# Patient Record
Sex: Male | Born: 1985 | Race: Black or African American | Hispanic: No | Marital: Single | Smoking: Never smoker
Health system: Southern US, Community
[De-identification: ages and names within clinical notes are randomized; demographics above are authoritative.]

---

## 2007-08-22 ENCOUNTER — Emergency Department (HOSPITAL_COMMUNITY): Admission: EM | Admit: 2007-08-22 | Discharge: 2007-08-22 | Payer: Self-pay | Admitting: Emergency Medicine

## 2011-02-23 ENCOUNTER — Emergency Department (HOSPITAL_COMMUNITY): Payer: BC Managed Care – PPO

## 2011-02-23 ENCOUNTER — Emergency Department (HOSPITAL_COMMUNITY)
Admission: EM | Admit: 2011-02-23 | Discharge: 2011-02-23 | Disposition: A | Discharge status: Home / Self Care | Payer: BC Managed Care – PPO | Attending: Emergency Medicine | Admitting: Emergency Medicine

## 2011-02-23 DIAGNOSIS — R0609 Other forms of dyspnea: Secondary | ICD-10-CM | POA: Insufficient documentation

## 2011-02-23 DIAGNOSIS — R509 Fever, unspecified: Secondary | ICD-10-CM | POA: Insufficient documentation

## 2011-02-23 DIAGNOSIS — R0989 Other specified symptoms and signs involving the circulatory and respiratory systems: Secondary | ICD-10-CM | POA: Insufficient documentation

## 2011-02-23 DIAGNOSIS — R0789 Other chest pain: Secondary | ICD-10-CM | POA: Insufficient documentation

## 2011-02-23 DIAGNOSIS — J45909 Unspecified asthma, uncomplicated: Secondary | ICD-10-CM | POA: Insufficient documentation

## 2011-02-23 LAB — DIFFERENTIAL
Basophils Absolute: 0 10*3/uL (ref 0.0–0.1)
Basophils Relative: 0 % (ref 0–1)
Eosinophils Absolute: 0 K/uL (ref 0.0–0.7)
Eosinophils Relative: 0 % (ref 0–5)
Lymphocytes Relative: 11 % — ABNORMAL LOW (ref 12–46)
Lymphs Abs: 2 K/uL (ref 0.7–4.0)
Monocytes Absolute: 2.1 K/uL — ABNORMAL HIGH (ref 0.1–1.0)
Monocytes Relative: 12 % (ref 3–12)
Neutro Abs: 13.7 10*3/uL — ABNORMAL HIGH (ref 1.7–7.7)
Neutrophils Relative %: 77 % (ref 43–77)

## 2011-02-23 LAB — POCT I-STAT, CHEM 8
BUN: 7 mg/dL (ref 6–23)
Calcium, Ion: 1.08 mmol/L — ABNORMAL LOW (ref 1.12–1.32)
Chloride: 98 meq/L (ref 96–112)
Creatinine, Ser: 1.3 mg/dL (ref 0.4–1.5)
Glucose, Bld: 119 mg/dL — ABNORMAL HIGH (ref 70–99)
HCT: 41 % (ref 39.0–52.0)
Hemoglobin: 13.9 g/dL (ref 13.0–17.0)
Potassium: 3.6 mEq/L (ref 3.5–5.1)
Sodium: 135 mEq/L (ref 135–145)
TCO2: 25 mmol/L (ref 0–100)

## 2011-02-23 LAB — CBC
HCT: 38.6 % — ABNORMAL LOW (ref 39.0–52.0)
Hemoglobin: 12.8 g/dL — ABNORMAL LOW (ref 13.0–17.0)
MCH: 27.5 pg (ref 26.0–34.0)
MCHC: 33.2 g/dL (ref 30.0–36.0)
MCV: 83 fL (ref 78.0–100.0)
Platelets: 240 K/uL (ref 150–400)
RBC: 4.65 MIL/uL (ref 4.22–5.81)
RDW: 14.1 % (ref 11.5–15.5)
WBC: 17.9 10*3/uL — ABNORMAL HIGH (ref 4.0–10.5)

## 2011-02-23 LAB — URINALYSIS, ROUTINE W REFLEX MICROSCOPIC
Bilirubin Urine: NEGATIVE
Glucose, UA: NEGATIVE mg/dL
Ketones, ur: NEGATIVE mg/dL
Leukocytes, UA: NEGATIVE
Nitrite: NEGATIVE
Protein, ur: NEGATIVE mg/dL
Specific Gravity, Urine: 1.004 — ABNORMAL LOW (ref 1.005–1.030)
Urobilinogen, UA: 0.2 mg/dL (ref 0.0–1.0)
pH: 7 (ref 5.0–8.0)

## 2011-02-23 LAB — URINE MICROSCOPIC-ADD ON

## 2011-02-23 LAB — POCT CARDIAC MARKERS
CKMB, poc: <1 ng/mL — ABNORMAL LOW (ref 1.0–8.0)
Myoglobin, poc: 80.6 ng/mL (ref 12–200)
Troponin i, poc: <0.05 ng/mL (ref 0.00–0.09)

## 2011-03-14 ENCOUNTER — Encounter: Payer: Self-pay | Admitting: Internal Medicine

## 2011-03-14 ENCOUNTER — Ambulatory Visit (INDEPENDENT_AMBULATORY_CARE_PROVIDER_SITE_OTHER): Payer: BC Managed Care – PPO | Admitting: Internal Medicine

## 2011-03-14 VITALS — BP 132/68 | HR 95 | Temp 98.2°F | Ht 71.0 in | Wt 270.2 lb

## 2011-03-14 DIAGNOSIS — R0989 Other specified symptoms and signs involving the circulatory and respiratory systems: Secondary | ICD-10-CM

## 2011-03-14 DIAGNOSIS — R0609 Other forms of dyspnea: Secondary | ICD-10-CM

## 2011-03-14 DIAGNOSIS — J45901 Unspecified asthma with (acute) exacerbation: Secondary | ICD-10-CM

## 2011-03-14 DIAGNOSIS — R05 Cough: Secondary | ICD-10-CM

## 2011-03-14 DIAGNOSIS — R0600 Dyspnea, unspecified: Secondary | ICD-10-CM

## 2011-03-14 DIAGNOSIS — R059 Cough, unspecified: Secondary | ICD-10-CM | POA: Insufficient documentation

## 2011-03-14 MED ORDER — MOMETASONE FURO-FORMOTEROL FUM 200-5 MCG/ACT IN AERO: INHALATION_SPRAY | RESPIRATORY_TRACT | Status: DC

## 2011-03-14 MED ORDER — AZITHROMYCIN 250 MG PO TABS: ORAL_TABLET | ORAL | Status: AC

## 2011-03-14 NOTE — Assessment & Plan Note (Addendum)
DDX of  difficult airways managment all start with A and  include Adherence, Ace Inhibitors, Acid Reflux, Active Sinus Disease, Alpha 1 Antitripsin deficiency, Anxiety masquerading as Airways dz,  ABPA,  allergy(esp in young), Aspiration (esp in elderly), Adverse effects of DPI,  Active smokers, plus two Bs  = Bronchiectasis and Beta blocker use..and one C= CHF   In this case Adherence is the biggest issue and starts with  inability to use HFA effectively and also  understand that SABA treats the symptoms but doesn't get to the underlying problem (inflammation).  I used  the analogy of putting steroid cream on a rash to help explain the meaning of topical therapy and the need to get the drug to the target tissue.    The proper method of use, as well as anticipated side effects, of this metered-dose inhaler are discussed and demonstrated to the patient. Improved to 75% p coaching.  ? Acid reflux >  See instructions for specific recommendations which were reviewed directly with the patient who was given a copy with highlighter outlining the key components.   ? Acute/ active Sinus dz > next step if not controlled   ? Allergic > next step if not controlled

## 2011-03-14 NOTE — Progress Notes (Signed)
  Subjective:    Patient ID: Ryan Levy, male    DOB: December 11, 1985, 25 y.o.   MRN: [id number]  HPI  Primary = Leonides Sake   24 yobm asthma all his life previously followed in [place] and never saw specialist and went months at a time without rx. Moved into new house in August 2011 and referred by Dr Tiburcio Pea for eval of difficult to control symptoms since Jan 12   03/14/2011 Initial pulmonary office eval cc new onset Jan 2012 bilateral chest pain/ tightness,  worse sitting still and worse lying down  Assoc with sob and then classic (for him)  asthma flare and everything better when prednsione rx from ER [facility] since 3/29 and flared with taper so restarted by Dr Tiburcio Pea and "all better again".  No discolored sputum. Flares occurred while on new maint rx with singulair and advair @ max dose  Pt denies any significant sore throat, dysphagia, itching, sneezing,  nasal congestion or excess/ purulent secretions,  fever, chills, sweats, unintended wt loss, pleuritic or exertional cp, hempoptysis, orthopnea pnd or leg swelling.    Also denies any obvious fluctuation of symptoms with weather or environmental changes or other aggravating or alleviating factors.       PMHx Asthma     - HFA 75% 03/14/2011     Review of Systems  Constitutional: Negative for fever, chills, activity change, appetite change and unexpected weight change.  HENT: Positive for congestion. Negative for sore throat, rhinorrhea, sneezing, trouble swallowing, dental problem, voice change and postnasal drip.   Eyes: Negative for visual disturbance.  Respiratory: Positive for cough and shortness of breath. Negative for choking.   Cardiovascular: Positive for chest pain. Negative for leg swelling.  Gastrointestinal: Negative for nausea, vomiting and abdominal pain.  Genitourinary: Negative for difficulty urinating.  Musculoskeletal: Negative for arthralgias.  Skin: Negative for rash.  Neurological: Positive for headaches.    Psychiatric/Behavioral: Negative for behavioral problems and confusion.       Objective:   Physical Exam    robust muscular black male nad Wt  270 03/14/2011  HEENT mild turbinate edema.  Oropharynx no thrush or excess pnd or cobblestoning.  No JVD or cervical adenopathy. Mild accessory muscle hypertrophy. Trachea midline, nl thryroid. Chest was hyperinflated by percussion with diminished breath sounds and moderate increased exp time with bilateral mid exp wheeze. Hoover sign positive at mid inspiration. Regular rate and rhythm without murmur gallop or rub or increase P2 or edema.  Abd: no hsm, nl excursion. Ext warm without cyanosis or clubbing.      Assessment & Plan:

## 2011-03-14 NOTE — Patient Instructions (Signed)
Dulera 200  Take 2 puffs first thing in am and then another 2 puffs about 12 hours later.  If not breathing well after dulera ok to proaire and back it up with your nebulizer   Work on inhaler technique:  relax and gently blow all the way out then take a nice smooth deep breath back in, triggering the inhaler at same time you start breathing in.  Hold for up to 5 seconds if you can.  Rinse and gargle with water when done   If your mouth or throat starts to bother you,   I suggest you time the inhaler to your dental care and after using the inhaler(s) brush teeth and tongue with a baking soda containing toothpaste and when you rinse this out, gargle with it first to see if this helps your mouth and throat.     Taper prednisone off and finish the zpak  As long as your asthma flares  Prilosec 20 mg Take 30-60 min before first meal of the day and Pepcid 20 mg one at bedtime because reflux is to asthma what oxygen is to fire.   GERD (REFLUX)  is an extremely common cause of respiratory symptoms, many times with no significant heartburn at all.    It can be treated with medication, but also with lifestyle changes including avoidance of late meals, excessive alcohol, smoking cessation, and avoid fatty foods, chocolate, peppermint, colas, red wine, and acidic juices such as orange juice.  NO MINT OR MENTHOL PRODUCTS SO NO COUGH DROPS  USE SUGARLESS CANDY INSTEAD (jolley ranchers or Stover's)  NO OIL BASED VITAMINS   Please schedule a follow up office visit in 2 weeks, sooner if needed

## 2011-03-14 NOTE — Assessment & Plan Note (Signed)
The most common causes of chronic cough in immunocompetent adults include the following: upper airway cough syndrome (UACS), previously referred to as postnasal drip syndrome (PNDS), which is caused by variety of rhinosinus conditions; (2) asthma; (3) GERD; (4) chronic bronchitis from cigarette smoking or other inhaled environmental irritants; (5) nonasthmatic eosinophilic bronchitis; and (6) bronchiectasis.   These conditions, singly or in combination, have accounted for up to 94% of the causes of chronic cough in prospective studies.   Other conditions have constituted no >6% of the causes in prospective studies These have included bronchogenic carcinoma, chronic interstitial pneumonia, sarcoidosis, left ventricular failure, ACEI-induced cough, and aspiration from a condition associated with pharyngeal dysfunction.   .Chronic cough is often simultaneously caused by more than one condition. A single cause has been found from 38 to 82% of the time, multiple causes from 18 to 62%. Multiply caused cough has been the result of three diseases up to 42% of the time.    Much of his cough is suggestive of Upper airway cough syndrome, so named because it's frequently impossible to sort out how much is  CR/sinusitis with freq throat clearing (which can be related to primary GERD)   vs  causing  secondary (" extra esophageal")  GERD from wide swings in gastric pressure that occur with throat clearing, often  promoting self use of mint and menthol lozenges that reduce the lower esophageal sphincter tone and exacerbate the problem further in a cyclical fashion.   These are the same pts who not infrequently have failed to tolerate ace inhibitors,  dry powder inhalers or biphosphonates or report having reflux symptoms that don't respond to standard doses of PPI , and are easily confused as having aecopd or asthma flares, so for now stop the dpi and rx reflux then sinus ct if continues to be difficult to control.

## 2011-03-21 ENCOUNTER — Emergency Department (HOSPITAL_COMMUNITY)
Admission: EM | Admit: 2011-03-21 | Discharge: 2011-03-21 | Disposition: A | Discharge status: Home / Self Care | Payer: BC Managed Care – PPO | Attending: Emergency Medicine | Admitting: Emergency Medicine

## 2011-03-21 DIAGNOSIS — Z79899 Other long term (current) drug therapy: Secondary | ICD-10-CM | POA: Insufficient documentation

## 2011-03-21 DIAGNOSIS — J45909 Unspecified asthma, uncomplicated: Secondary | ICD-10-CM | POA: Insufficient documentation

## 2011-03-21 DIAGNOSIS — K2970 Gastritis, unspecified, without bleeding: Secondary | ICD-10-CM | POA: Insufficient documentation

## 2011-03-21 DIAGNOSIS — K219 Gastro-esophageal reflux disease without esophagitis: Secondary | ICD-10-CM | POA: Insufficient documentation

## 2011-03-21 DIAGNOSIS — R071 Chest pain on breathing: Secondary | ICD-10-CM | POA: Insufficient documentation

## 2011-03-21 DIAGNOSIS — R0602 Shortness of breath: Secondary | ICD-10-CM | POA: Insufficient documentation

## 2011-03-21 DIAGNOSIS — R1013 Epigastric pain: Secondary | ICD-10-CM | POA: Insufficient documentation

## 2011-03-22 ENCOUNTER — Encounter: Payer: Self-pay | Admitting: Pulmonary Disease

## 2011-03-22 ENCOUNTER — Ambulatory Visit (INDEPENDENT_AMBULATORY_CARE_PROVIDER_SITE_OTHER)
Admission: RE | Admit: 2011-03-22 | Discharge: 2011-03-22 | Disposition: A | Discharge status: Home / Self Care | Payer: BC Managed Care – PPO | Source: Ambulatory Visit | Attending: Pulmonary Disease | Admitting: Pulmonary Disease

## 2011-03-22 ENCOUNTER — Other Ambulatory Visit (INDEPENDENT_AMBULATORY_CARE_PROVIDER_SITE_OTHER): Payer: BC Managed Care – PPO

## 2011-03-22 ENCOUNTER — Ambulatory Visit (INDEPENDENT_AMBULATORY_CARE_PROVIDER_SITE_OTHER): Payer: BC Managed Care – PPO | Admitting: Pulmonary Disease

## 2011-03-22 VITALS — BP 148/96 | HR 116 | Temp 101.5°F | Ht 71.0 in | Wt 276.2 lb

## 2011-03-22 DIAGNOSIS — R0600 Dyspnea, unspecified: Secondary | ICD-10-CM

## 2011-03-22 DIAGNOSIS — R509 Fever, unspecified: Secondary | ICD-10-CM

## 2011-03-22 DIAGNOSIS — J45901 Unspecified asthma with (acute) exacerbation: Secondary | ICD-10-CM

## 2011-03-22 DIAGNOSIS — R0609 Other forms of dyspnea: Secondary | ICD-10-CM

## 2011-03-22 LAB — CBC WITH DIFFERENTIAL/PLATELET
Basophils Relative: 0.1 % (ref 0.0–3.0)
Eosinophils Relative: 0.1 % (ref 0.0–5.0)
HCT: 37.4 % — ABNORMAL LOW (ref 39.0–52.0)
Hemoglobin: 12.6 g/dL — ABNORMAL LOW (ref 13.0–17.0)
Lymphocytes Relative: 5 % — ABNORMAL LOW (ref 12.0–46.0)
Lymphs Abs: 1.1 10*3/uL (ref 0.7–4.0)
Monocytes Relative: 5.9 % (ref 3.0–12.0)
Neutro Abs: 19.5 10*3/uL — ABNORMAL HIGH (ref 1.4–7.7)
RBC: 4.39 Mil/uL (ref 4.22–5.81)
RDW: 15.9 % — ABNORMAL HIGH (ref 11.5–14.6)

## 2011-03-22 LAB — COMPREHENSIVE METABOLIC PANEL
ALT: 17 U/L (ref 0–53)
BUN: 6 mg/dL (ref 6–23)
CO2: 30 mEq/L (ref 19–32)
Calcium: 9.2 mg/dL (ref 8.4–10.5)
Chloride: 95 mEq/L — ABNORMAL LOW (ref 96–112)
Creatinine, Ser: 1.2 mg/dL (ref 0.4–1.5)
GFR: 98.49 mL/min (ref >60.00)
Total Bilirubin: 1.2 mg/dL (ref 0.3–1.2)

## 2011-03-22 MED ORDER — LEVOFLOXACIN 500 MG PO TABS: 500.0000 mg | ORAL_TABLET | Freq: Every day | ORAL | Status: DC

## 2011-03-22 MED ORDER — PREDNISONE 10 MG PO TABS: ORAL_TABLET | ORAL | Status: DC

## 2011-03-22 NOTE — Progress Notes (Signed)
Subjective:    Patient ID: Ryan Levy, male    DOB: 1986-04-09, 25 y.o.   MRN: [id number]  HPI 25 yo male with asthma, chest pain, and fever.  He was seen by Dr. Sherene Sires on 03/14/11 for asthma and chronic cough.  He was started on dulera and zithromax.  He finished prednisone a few days ago, and said that his symptoms got worse since then.  He has been getting more short of breath.  He has pain around his ribs when he breaths.  He tries to cough, but then can't get his wind.  He has been wheezing more.  He has been feeling feverish since this morning, and feeling sweaty.  His sinuses are okay.  He has a sore throat and it hurts when he swallows.  He hasn't been able to eat much, and does not have much of an appetite.  He has been getting nausea and dry heaves.    He denies skin rash, gland swelling, leg swelling, or diarrhea.  He does not smoke cigarettes and denies illicit drug use.  He has occasional alcohol use.  He works as a Psychologist, occupational with [employer].  He denies sick exposures or travel history.  Past Medical History  Diagnosis Date  . Asthma      Family History  Problem Relation Age of Onset  . Asthma Maternal Grandfather      History   Social History  . Marital Status: Single    Spouse Name: N/A    Number of Children: N/A  . Years of Education: N/A   Occupational History  . Banker    Social History Main Topics  . Smoking status: Never Smoker   . Smokeless tobacco: Never Used  . Alcohol Use: No     social  . Drug Use: No  . Sexually Active: Not on file   Other Topics Concern  . Not on file   Social History Narrative  . No narrative on file     No Known Allergies   Outpatient Prescriptions Prior to Visit  Medication Sig Dispense Refill  . albuterol (PROAIR HFA) 108 (90 BASE) MCG/ACT inhaler Inhale 2 puffs into the lungs every 6 (six) hours as needed.        Marland Kitchen albuterol (PROVENTIL) (2.5 MG/3ML) 0.083% nebulizer solution Take 1 vial by nebulization Every 4  hours as needed.      . fluticasone (FLONASE) 50 MCG/ACT nasal spray 1 puff by Nasal route Twice daily.      . Mometasone Furo-Formoterol Fum (DULERA) 200-5 MCG/ACT AERO Take 2 puffs first thing in am and then another 2 puffs about 12 hours later.         . montelukast (SINGULAIR) 10 MG tablet Take 10 mg by mouth daily.        . meloxicam (MOBIC) 7.5 MG tablet Take 1 tablet by mouth as needed.      . predniSONE (DELTASONE) 10 MG tablet Tapered dose as directed            Review of Systems     Objective:   Physical Exam Filed Vitals:   03/22/11 0937  BP: 148/96  Pulse: 116  Temp: 101.5 F (38.6 C)  TempSrc: Oral  Height: 5\' 11"  (1.803 m)  Weight: 276 lb 3.2 oz (125.283 kg)  SpO2: 99%   General - Obese, sweaty, speaks in short sentences HEENT - PERRLA, EOMI, no sinus tenderness, no nasal discharge, no oral exudate, no LAN, no thyromegaly Cardiac - s1s2  regular, tachycardic, no murmur, pulses symmetric Chest - tender on palpation over rib margin, diminished breath sounds, no wheeze/rales, no dullness Abd - soft, mild epigastric tenderness, normal bowel sounds, no organomegaly Ext - no E/C/C Neuro - normal strength, CN intact, A&O x 3 Psych - anxious     CHEST - 2 VIEW 03/22/11:  Comparison: 02/23/2011.  Findings: Trachea is midline. Heart size stable. Lungs are low in volume with right perihilar and bibasilar subsegmental atelectasis. No pleural fluid.  IMPRESSION: Low lung volumes with right perihilar and bibasilar subsegmental atelectasis.  WBC 22.  Other labs from today reviewed.   Assessment & Plan:   Fever Will change him from zithromax to levaquin.  Advised him to call if his symptoms do not improve.  Will schedule follow up next week.  Asthma flare He is to continue dulera, singulair and prn albuterol.  Will give him another course of prednisone, and then have this re-assessed next week.    Updated Medication List Outpatient Encounter  Prescriptions as of 03/22/2011  Medication Sig Dispense Refill  . albuterol (PROAIR HFA) 108 (90 BASE) MCG/ACT inhaler Inhale 2 puffs into the lungs every 6 (six) hours as needed.        Marland Kitchen albuterol (PROVENTIL) (2.5 MG/3ML) 0.083% nebulizer solution Take 1 vial by nebulization Every 4 hours as needed.      . fluticasone (FLONASE) 50 MCG/ACT nasal spray 1 puff by Nasal route Twice daily.      . Mometasone Furo-Formoterol Fum (DULERA) 200-5 MCG/ACT AERO Take 2 puffs first thing in am and then another 2 puffs about 12 hours later.         . montelukast (SINGULAIR) 10 MG tablet Take 10 mg by mouth daily.        Marland Kitchen oxyCODONE-acetaminophen (PERCOCET) 10-325 MG per tablet 2 tablets as needed every 6 hrs       . DISCONTD: azithromycin (ZITHROMAX) 250 MG tablet Take 2 tablets by mouth on day 1, followed by 1 tablet by mouth daily for 4 days. Once a day x 2 more days       . levofloxacin (LEVAQUIN) 500 MG tablet Take 1 tablet (500 mg total) by mouth daily.  7 tablet  0  . predniSONE (DELTASONE) 10 MG tablet 3 pills daily for 2 days, 2 pills daily for 2 days, 1 pill daily for 2 days, 1/2 pill daily for 2 days  13 tablet  0  . DISCONTD: meloxicam (MOBIC) 7.5 MG tablet Take 1 tablet by mouth as needed.      Marland Kitchen DISCONTD: predniSONE (DELTASONE) 10 MG tablet Tapered dose as directed

## 2011-03-22 NOTE — Assessment & Plan Note (Addendum)
He is to continue dulera, singulair and prn albuterol.  Will give him another course of prednisone, and then have this re-assessed next week.

## 2011-03-22 NOTE — Assessment & Plan Note (Addendum)
Will change him from zithromax to levaquin.  Advised him to call if his symptoms do not improve.  Will schedule follow up next week.

## 2011-03-22 NOTE — Patient Instructions (Addendum)
Stop zithromax Levaquin 500 mg daily for 7 days Prednisone 10 mg pills: 3 pills for 2 days, 2 pills for 2 days, 1 pill for 2 days, 1/2 pill for 2 days Follow up with Dr. Craige Cotta or Tammy Parrett in one week

## 2011-03-27 ENCOUNTER — Encounter: Payer: Self-pay | Admitting: Adult Health

## 2011-03-28 ENCOUNTER — Encounter: Payer: Self-pay | Admitting: Adult Health

## 2011-03-28 ENCOUNTER — Institutional Professional Consult (permissible substitution): Payer: BC Managed Care – PPO | Admitting: Pulmonary Disease

## 2011-03-28 ENCOUNTER — Ambulatory Visit (INDEPENDENT_AMBULATORY_CARE_PROVIDER_SITE_OTHER): Payer: BC Managed Care – PPO | Admitting: Adult Health

## 2011-03-28 ENCOUNTER — Ambulatory Visit (INDEPENDENT_AMBULATORY_CARE_PROVIDER_SITE_OTHER)
Admission: RE | Admit: 2011-03-28 | Discharge: 2011-03-28 | Disposition: A | Discharge status: Home / Self Care | Payer: BC Managed Care – PPO | Source: Ambulatory Visit | Attending: Adult Health | Admitting: Adult Health

## 2011-03-28 ENCOUNTER — Other Ambulatory Visit (INDEPENDENT_AMBULATORY_CARE_PROVIDER_SITE_OTHER): Payer: BC Managed Care – PPO

## 2011-03-28 VITALS — BP 118/74 | HR 97 | Temp 97.9°F | Ht 71.0 in | Wt 266.4 lb

## 2011-03-28 DIAGNOSIS — R091 Pleurisy: Secondary | ICD-10-CM

## 2011-03-28 DIAGNOSIS — J45901 Unspecified asthma with (acute) exacerbation: Secondary | ICD-10-CM

## 2011-03-28 LAB — CBC WITH DIFFERENTIAL/PLATELET
Basophils Absolute: 0.1 10*3/uL (ref 0.0–0.1)
Eosinophils Absolute: 0.1 10*3/uL (ref 0.0–0.7)
HCT: 38.8 % — ABNORMAL LOW (ref 39.0–52.0)
Hemoglobin: 13 g/dL (ref 13.0–17.0)
Lymphs Abs: 2.6 10*3/uL (ref 0.7–4.0)
MCHC: 33.5 g/dL (ref 30.0–36.0)
MCV: 84.9 fl (ref 78.0–100.0)
Monocytes Absolute: 0.8 10*3/uL (ref 0.1–1.0)
Monocytes Relative: 6.1 % (ref 3.0–12.0)
Neutro Abs: 10.3 10*3/uL — ABNORMAL HIGH (ref 1.4–7.7)
Platelets: 360 10*3/uL (ref 150.0–400.0)
RDW: 15.7 % — ABNORMAL HIGH (ref 11.5–14.6)

## 2011-03-28 LAB — SEDIMENTATION RATE: Sed Rate: 85 mm/hr — ABNORMAL HIGH (ref 0–22)

## 2011-03-28 NOTE — Assessment & Plan Note (Addendum)
Slowly resolving asthmatic broncitis PT w/ residual pleuritic left chest pain, and recurrent bronchitic symptoms after steroid taper  Will recheck cxr to r/o possible PNA,etc . Recheck labs w/ cbc and esr to check for decrease wbc  Plan:  tx w/ NSAIDS for pleurisy, hold on additional steroids at this time.  Heat to ribs.  mucinex dm As needed  For cough.  Labs and xray pending.  follow up Dr. Craige Cotta  In 3 weeks and As needed   Please contact office for sooner follow up if symptoms do not improve or worsen or seek emergency care

## 2011-03-28 NOTE — Patient Instructions (Addendum)
Advil 200mg  3 tabs Twice daily  With food for 7 days .  Warm heat to ribs and shoulder.  Continue on Dulera 2 puffs Twice daily   follow up Dr. Craige Cotta  In 3 weeks and As needed   Please contact office for sooner follow up if symptoms do not improve or worsen or seek emergency care   I will call with xray and lab results

## 2011-03-28 NOTE — Progress Notes (Signed)
Subjective:    Patient ID: Ryan Levy, male    DOB: 1986-06-30, 25 y.o.   MRN: [id number]  HPI 25 yo male with asthma, chest pain, and fever.  03/22/11  He was seen by Dr. Sherene Sires on 03/14/11 for asthma and chronic cough.  He was started on dulera and zithromax.  He finished prednisone a few days ago, and said that his symptoms got worse since then. He has been getting more short of breath.  He has pain around his ribs when he breaths.  He tries to cough, but then can't get his wind.  He has been wheezing more.  He has been feeling feverish since this morning, and feeling sweaty.  His sinuses are okay.  He has a sore throat and it hurts when he swallows.  He hasn't been able to eat much, and does not have much of an appetite.  He has been getting nausea and dry heaves.  >>tx w/ levaquin and steroid taper.    03/28/11 Follow up  PT presents for follow up. Seen last ov with suspected asthmatic bronchitis tx with steroid taper and Levaquin. WBC was elevated , CXR showed some bibasilar atx w/ low lung volumes. He returns today with improved cough, congestion and wheezing .  However  began having pain again today w/ the decrease in prednisone, progressively getting worse w/ deep inhalations.    Still has cough but not as bad as last week.  Says he had this same episode earlier year and was seen in ER and PCP . This is his 3rd steroid taper. As he finishes his  Prednisone his symptoms return.  Feels very sore along upper neck muscles esp on left. Pain on inspiration along left lateral ribs.  NO vomitting or abd pain. NO urinary symptoms or rash. NO joint swelling.   He is leaving for [facility] in few days.   Past Medical History  Diagnosis Date  . Asthma      Family History  Problem Relation Age of Onset  . Asthma Maternal Grandfather      History   Social History  . Marital Status: Single    Spouse Name: N/A    Number of Children: N/A  . Years of Education: N/A   Occupational History  .  Banker    Social History Main Topics  . Smoking status: Never Smoker   . Smokeless tobacco: Never Used  . Alcohol Use: No     social  . Drug Use: No  . Sexually Active: Not on file   Other Topics Concern  . Not on file   Social History Narrative  . No narrative on file     No Known Allergies   Outpatient Prescriptions Prior to Visit  Medication Sig Dispense Refill  . albuterol (PROAIR HFA) 108 (90 BASE) MCG/ACT inhaler Inhale 2 puffs into the lungs every 6 (six) hours as needed.        Marland Kitchen albuterol (PROVENTIL) (2.5 MG/3ML) 0.083% nebulizer solution Take 1 vial by nebulization Every 4 hours as needed.      . Mometasone Furo-Formoterol Fum (DULERA) 200-5 MCG/ACT AERO Take 2 puffs first thing in am and then another 2 puffs about 12 hours later.         . montelukast (SINGULAIR) 10 MG tablet Take 10 mg by mouth daily.        Marland Kitchen oxyCODONE-acetaminophen (PERCOCET) 10-325 MG per tablet 2 tablets as needed every 6 hrs       . predniSONE (  DELTASONE) 10 MG tablet 3 pills daily for 2 days, 2 pills daily for 2 days, 1 pill daily for 2 days, 1/2 pill daily for 2 days  13 tablet  0  . fluticasone (FLONASE) 50 MCG/ACT nasal spray 1 puff by Nasal route Twice daily.      Marland Kitchen levofloxacin (LEVAQUIN) 500 MG tablet Take 1 tablet (500 mg total) by mouth daily.  7 tablet  0        Review of Systems Constitutional:   No  weight loss, night sweats,  Fevers, chills   HEENT:   No headaches,  Difficulty swallowing,  Tooth/dental problems, or  Sore throat,                No sneezing, itching, ear ache, nasal congestion, post nasal drip,   CV:  No chest pain,  Orthopnea, PND, swelling in lower extremities, anasarca, dizziness, palpitations, syncope.   GI  No heartburn, indigestion, abdominal pain, nausea, vomiting, diarrhea, change in bowel habits, loss of appetite, bloody stools.   Resp:    No coughing up of blood.  No change in color of mucus.  No wheezing.  No chest wall deformity  Skin: no rash  or lesions.  GU: no dysuria, change in color of urine, no urgency or frequency.  No flank pain, no hematuria   MS:  No joint pain or swelling.  No decreased range of motion.  No back pain.  Psych:  No change in mood or affect. No depression or anxiety.  No memory loss.          Objective:   Physical Exam  Filed Vitals:   03/28/11 1522  BP: 118/74  Pulse: 97  Temp: 97.9 F (36.6 C)  TempSrc: Oral  Height: 5\' 11"  (1.803 m)  Weight: 266 lb 6.4 oz (120.838 kg)  SpO2: 97%   General - Obese, NAD  HEENT - PERRLA, EOMI, no sinus tenderness, no nasal discharge, no oral exudate, no LAN, no thyromegaly Cardiac - s1s2 regular, tachycardic, no murmur, pulses symmetric Chest - tender on palpation over rib margin along left  NML BS bilaterally , no wheeze/rales, no dullness Abd - soft, mild epigastric tenderness, normal bowel sounds, no organomegaly Ext - no E/C/C Neuro - normal strength, CN intact, A&O x 3, neg nuchal rigidity.  Psych - nml  Muscul: cervical ROM nml , no radicular sympotms, equal strength bilaterally.        Assessment & Plan:   No problem-specific assessment & plan notes found for this encounter.   Updated Medication List Outpatient Encounter Prescriptions as of 03/28/2011  Medication Sig Dispense Refill  . albuterol (PROAIR HFA) 108 (90 BASE) MCG/ACT inhaler Inhale 2 puffs into the lungs every 6 (six) hours as needed.        Marland Kitchen albuterol (PROVENTIL) (2.5 MG/3ML) 0.083% nebulizer solution Take 1 vial by nebulization Every 4 hours as needed.      . Mometasone Furo-Formoterol Fum (DULERA) 200-5 MCG/ACT AERO Take 2 puffs first thing in am and then another 2 puffs about 12 hours later.         . montelukast (SINGULAIR) 10 MG tablet Take 10 mg by mouth daily.        Marland Kitchen oxyCODONE-acetaminophen (PERCOCET) 10-325 MG per tablet 2 tablets as needed every 6 hrs       . predniSONE (DELTASONE) 10 MG tablet 3 pills daily for 2 days, 2 pills daily for 2 days, 1 pill daily for  2 days, 1/2 pill  daily for 2 days  13 tablet  0  . fluticasone (FLONASE) 50 MCG/ACT nasal spray 1 puff by Nasal route Twice daily.      . ranitidine (ZANTAC) 150 MG tablet Take by mouth as needed.      Marland Kitchen DISCONTD: levofloxacin (LEVAQUIN) 500 MG tablet Take 1 tablet (500 mg total) by mouth daily.  7 tablet  0

## 2011-03-29 ENCOUNTER — Telehealth: Payer: Self-pay | Admitting: Adult Health

## 2011-03-29 MED ORDER — PREDNISONE 10 MG PO TABS: ORAL_TABLET | ORAL | Status: DC

## 2011-03-29 NOTE — Telephone Encounter (Signed)
Spoke w/ pt and advised him we were going to get in touch w/ Tammy to decide what she wants to do next. Pt states he is in a lot of pain and the advil is not helping. Pt states he will got to the ER if his pain gets worse and has not heard from [place] soon. WIll send to Shanda Bumps so she can document when she will speak to TP.

## 2011-03-29 NOTE — Telephone Encounter (Signed)
lmomtcb x1 

## 2011-03-29 NOTE — Progress Notes (Signed)
Reviewed

## 2011-03-29 NOTE — Telephone Encounter (Signed)
Patient called back wanted to know if we ever got in touch with Tammy P. he can be reached at [phone]. Per Lawson Fiscal if patient is in that much pain he needs to go to the ER he stated that he was going to his PCP and find out if there is someone else he can start using. Stated that we wanted our money on time but when he needed [place] the doctor wasn't here to help him.Vedia Coffer

## 2011-03-29 NOTE — Telephone Encounter (Signed)
Patient called back stated that he was in a lot of pain doesn't want it to get worse Advil isn't helping. He can't take deep breathe he can be reached at [phone] or [phone].Vedia Coffer

## 2011-03-29 NOTE — Telephone Encounter (Signed)
Called spoke with TP about pt's cxr and lab results.  Per TP: cxr shows nothing acute.  Sed rate elevated.  Please send pred taper and schedule appt w/ VS in 2 weeks to follow up.  Prednisone 10mg  #30, 4 tabs x 3days, 3 x 3days, 2 x 3 days, 1 x 3days and stop.  Called spoke with patient who expressed his frustration because he thought that someone was going to be in the office in case he called and needed something for the pain.  i apologized to the pt for the misunderstanding and the delay in getting back in touch with him.  Explained to pt his cxr and lab results/recs as stated by TP.  Pt verbalized his understanding.  rx sent to [facility] on [address] per pt's request.  appt w/ VS moved up from 5.23.12 to 5.14.12 @ 1330 - okay per Mindy.  Advised pt that VS will be in the office this afternoon and TP will be back all day on Thurs and Fri if he should have any more questions/concerns.  Pt okay with this and verbalized his understanding.

## 2011-03-30 ENCOUNTER — Telehealth: Payer: Self-pay | Admitting: Adult Health

## 2011-03-30 DIAGNOSIS — R059 Cough, unspecified: Secondary | ICD-10-CM

## 2011-03-30 DIAGNOSIS — R05 Cough: Secondary | ICD-10-CM

## 2011-03-30 DIAGNOSIS — R079 Chest pain, unspecified: Secondary | ICD-10-CM | POA: Insufficient documentation

## 2011-03-30 DIAGNOSIS — R509 Fever, unspecified: Secondary | ICD-10-CM

## 2011-03-30 NOTE — Telephone Encounter (Signed)
Pt with persistent symptoms each time steroids are stopped would like to set him for additional testing.  Set up CT chest w/ PE protocol. Re chest pain CT sinus -limited re cough/fever  CT neck re cough, fever, shoulder neck pain.  Labs with ESR , ANA, ANCA, CRP, RA factor  Left message on voicemail to call back

## 2011-03-30 NOTE — Telephone Encounter (Signed)
Spoke with pt advised on upcoming test and labs He feels much better since starting on steroids, with/in 1 hr pain resolved.  Pt returning on 04/04/11

## 2011-03-31 ENCOUNTER — Telehealth: Payer: Self-pay | Admitting: Adult Health

## 2011-03-31 NOTE — Telephone Encounter (Signed)
Tammy, can you please advise what his dx is that you had discussed with him earlier. Thanks!!

## 2011-03-31 NOTE — Telephone Encounter (Signed)
All labs and procedures scheduled in epic.  Per Almyra Free, pt scheduled for CT scans on 5.9.12 @ 1330.  Will close encounter.

## 2011-03-31 NOTE — Telephone Encounter (Signed)
No answer will try to call back at later time/date.

## 2011-04-05 ENCOUNTER — Ambulatory Visit (INDEPENDENT_AMBULATORY_CARE_PROVIDER_SITE_OTHER)
Admission: RE | Admit: 2011-04-05 | Discharge: 2011-04-05 | Disposition: A | Discharge status: Home / Self Care | Payer: BC Managed Care – PPO | Source: Ambulatory Visit | Attending: Adult Health | Admitting: Adult Health

## 2011-04-05 ENCOUNTER — Inpatient Hospital Stay: Admission: RE | Admit: 2011-04-05 | Payer: BC Managed Care – PPO | Source: Ambulatory Visit

## 2011-04-05 ENCOUNTER — Telehealth: Payer: Self-pay | Admitting: Adult Health

## 2011-04-05 DIAGNOSIS — R05 Cough: Secondary | ICD-10-CM

## 2011-04-05 DIAGNOSIS — R509 Fever, unspecified: Secondary | ICD-10-CM

## 2011-04-05 DIAGNOSIS — R059 Cough, unspecified: Secondary | ICD-10-CM

## 2011-04-05 DIAGNOSIS — R079 Chest pain, unspecified: Secondary | ICD-10-CM

## 2011-04-05 MED ORDER — IOHEXOL 300 MG/ML  SOLN
100.0000 mL | Freq: Once | INTRAMUSCULAR | Status: AC | PRN
  Administered 2011-04-05: 100 mL via INTRAVENOUS

## 2011-04-05 NOTE — Telephone Encounter (Signed)
Dr. Craige Cotta, TP ordered CT Chest on this pt and he wants results asap.  I advised TP out of the office this pm, so will forward to you for results/recs. Please advise thanks!

## 2011-04-06 NOTE — Telephone Encounter (Signed)
Please advise Dr. Craige Cotta pt requesting CT results ASAP. Thanks  Carver Fila, CMA

## 2011-04-06 NOTE — Telephone Encounter (Signed)
CT chest reviewed.  Left message on voicemail at listed number.  Explained that CT sinus was normal and CT chest showed basilar ground glass likely from atelectasis.  Will await remainder of lab results and then update patient about findings and additional interventions needed.

## 2011-04-06 NOTE — Telephone Encounter (Signed)
lmomtcb x1.Marland Kitchen Per Dr. Craige Cotta. Yes he can work out

## 2011-04-06 NOTE — Telephone Encounter (Signed)
Returning call.  Patient wanting to know if he can workout.  [phone]

## 2011-04-06 NOTE — Telephone Encounter (Signed)
Pt phoned stated that he had CT yesterday would like the results he can be reached at [phone].Vedia Coffer

## 2011-04-07 ENCOUNTER — Ambulatory Visit: Payer: BC Managed Care – PPO | Admitting: Internal Medicine

## 2011-04-07 NOTE — Telephone Encounter (Signed)
Pt cant answer cell at work please call [phone].Vedia Coffer

## 2011-04-07 NOTE — Telephone Encounter (Signed)
ATC pt at the number provided, he is still at lunch, [facility] later.

## 2011-04-07 NOTE — Telephone Encounter (Signed)
Noted  

## 2011-04-07 NOTE — Telephone Encounter (Signed)
Spoke w/ pt and he is aware it was fine for him to work out. Pt states he is now taking pred one tablet qd and states he is beginning to feel like he did before. Pt is coming in on Monday w/ VS for follow up. I advised pt to finished the pred since he states he has 3 days left and we will re evaluate him on Monday. Pt still wanted to make VS aware. Will forward to him as an [place]

## 2011-04-09 NOTE — Telephone Encounter (Signed)
He has ov with dr Craige Cotta on 5/14  Will discuss in detail at [facility]

## 2011-04-10 ENCOUNTER — Ambulatory Visit (INDEPENDENT_AMBULATORY_CARE_PROVIDER_SITE_OTHER): Payer: BC Managed Care – PPO | Admitting: Pulmonary Disease

## 2011-04-10 ENCOUNTER — Encounter: Payer: Self-pay | Admitting: *Deleted

## 2011-04-10 ENCOUNTER — Encounter: Payer: Self-pay | Admitting: Pulmonary Disease

## 2011-04-10 DIAGNOSIS — R091 Pleurisy: Secondary | ICD-10-CM

## 2011-04-10 DIAGNOSIS — J45909 Unspecified asthma, uncomplicated: Secondary | ICD-10-CM

## 2011-04-10 MED ORDER — PREDNISONE 5 MG PO TABS: ORAL_TABLET | ORAL | Status: DC

## 2011-04-10 NOTE — Assessment & Plan Note (Addendum)
He has continued symptoms of pleuritic chest pain that recur with tapering of dose of prednisone.  He did have elevated ESR, and recent fever which has resolved.  He did have mild, non-specific ground glass opacification on CT chest from 04/05/11, but this was done while he was on prednisone.  ANA, ANCA, RF, anti-CCP are pending.  Recent CBC showed improvement in WBC count.  He is now complaining of b/l knee pain and swell.  I will resume prednisone at 15 mg per day.  Will await lab test results.  Will also refer to rheumatology to evaluate for possible joint disease.  Asked him to call by the end of the week to update status and response to prednisone therapy.

## 2011-04-10 NOTE — Progress Notes (Signed)
Subjective:    Patient ID: Ryan Levy, male    DOB: 1986-08-24, 25 y.o.   MRN: [id number]  HPI 25 yo male with asthma, pleurisy, and fever.  He finished his course of levaquin.  He was put back on prednisone, and felt better.  Once he got down below 10 mg per day of prednisone his pleuritic chest pain recurred.  This is mostly on the right side.  He did not have any pain when on higher doses of prednisone.  He is not having fever any more.  His sinuses are better.  He still has a cough which is occasionally productive of yellow sputum.  He has noticed pain and swelling in both his knees.  He feels like his joints are tied in knots.    Past Medical History  Diagnosis Date  . Asthma      Family History  Problem Relation Age of Onset  . Asthma Maternal Grandfather      History   Social History  . Marital Status: Single    Spouse Name: N/A    Number of Children: N/A  . Years of Education: N/A   Occupational History  . Banker    Social History Main Topics  . Smoking status: Never Smoker   . Smokeless tobacco: Never Used  . Alcohol Use: No     social  . Drug Use: No  . Sexually Active: Not on file   Other Topics Concern  . Not on file   Social History Narrative  . No narrative on file     No Known Allergies   Outpatient Prescriptions Prior to Visit  Medication Sig Dispense Refill  . albuterol (PROAIR HFA) 108 (90 BASE) MCG/ACT inhaler Inhale 2 puffs into the lungs every 6 (six) hours as needed.        Marland Kitchen albuterol (PROVENTIL) (2.5 MG/3ML) 0.083% nebulizer solution Take 1 vial by nebulization Every 4 hours as needed.      . Mometasone Furo-Formoterol Fum (DULERA) 200-5 MCG/ACT AERO Take 2 puffs first thing in am and then another 2 puffs about 12 hours later.         . montelukast (SINGULAIR) 10 MG tablet Take 10 mg by mouth daily.        . ranitidine (ZANTAC) 150 MG tablet Take by mouth as needed.      . fluticasone (FLONASE) 50 MCG/ACT nasal spray 1 puff by Nasal  route Twice daily.      Marland Kitchen oxyCODONE-acetaminophen (PERCOCET) 10-325 MG per tablet 2 tablets as needed every 6 hrs       . predniSONE (DELTASONE) 10 MG tablet 3 pills daily for 2 days, 2 pills daily for 2 days, 1 pill daily for 2 days, 1/2 pill daily for 2 days  13 tablet  0  . predniSONE (DELTASONE) 10 MG tablet Take 4 tabs by mouth once daily x3 days, then 3 tabs x 3days, 2 tabs x3 days, 1 tab x3days and stop.  30 tablet  0   Review of Systems     Objective:   Physical Exam Filed Vitals:   04/10/11 1339 04/10/11 1340  BP:  140/84  Pulse:  100  Temp: 98 F (36.7 C)   TempSrc: Oral   Height: 5\' 11"  (1.803 m)   Weight: 268 lb 3.2 oz (121.655 kg)   SpO2:  100%   General - Obese, splinting right side with respiration HEENT - PERRLA, EOMI, no sinus tenderness, no nasal discharge, no oral exudate, no LAN,  no thyromegaly  Cardiac - s1s2 regular, tachycardic, no murmur, pulses symmetric  Chest - tender on palpation over rib margin, diminished breath sounds, no wheeze/rales, no dullness  Abd - soft, mild epigastric tenderness, normal bowel sounds, no organomegaly  Ext - mild swelling/tenderness of knees b/l, no edema Neuro - normal strength, CN intact, A&O x 3  Psych - anxious     Assessment & Plan:   Pleurisy He has continued symptoms of pleuritic chest pain that recur with tapering of dose of prednisone.  He did have elevated ESR, and recent fever which has resolved.  He did have mild, non-specific ground glass opacification on CT chest from 04/05/11, but this was done while he was on prednisone.  ANA, ANCA, RF, anti-CCP are pending.  Recent CBC showed improvement in WBC count.  He is now complaining of b/l knee pain and swell.  I will resume prednisone at 15 mg per day.  Will await lab test results.  Will also refer to rheumatology to evaluate for possible joint disease.  Asked him to call by the end of the week to update status and response to prednisone therapy.  Asthma Will have  him continue dulera, singulair, and as needed albuterol.    Updated Medication List Outpatient Encounter Prescriptions as of 04/10/2011  Medication Sig Dispense Refill  . albuterol (PROAIR HFA) 108 (90 BASE) MCG/ACT inhaler Inhale 2 puffs into the lungs every 6 (six) hours as needed.        Marland Kitchen albuterol (PROVENTIL) (2.5 MG/3ML) 0.083% nebulizer solution Take 1 vial by nebulization Every 4 hours as needed.      Marland Kitchen ibuprofen (ADVIL,MOTRIN) 200 MG tablet As needed       . Mometasone Furo-Formoterol Fum (DULERA) 200-5 MCG/ACT AERO Take 2 puffs first thing in am and then another 2 puffs about 12 hours later.         . montelukast (SINGULAIR) 10 MG tablet Take 10 mg by mouth daily.        . ranitidine (ZANTAC) 150 MG tablet Take by mouth as needed.      . predniSONE (DELTASONE) 5 MG tablet 3 pills daily  90 tablet  1  . DISCONTD: fluticasone (FLONASE) 50 MCG/ACT nasal spray 1 puff by Nasal route Twice daily.      Marland Kitchen DISCONTD: oxyCODONE-acetaminophen (PERCOCET) 10-325 MG per tablet 2 tablets as needed every 6 hrs       . DISCONTD: predniSONE (DELTASONE) 10 MG tablet 3 pills daily for 2 days, 2 pills daily for 2 days, 1 pill daily for 2 days, 1/2 pill daily for 2 days  13 tablet  0  . DISCONTD: predniSONE (DELTASONE) 10 MG tablet Take 4 tabs by mouth once daily x3 days, then 3 tabs x 3days, 2 tabs x3 days, 1 tab x3days and stop.  30 tablet  0

## 2011-04-10 NOTE — Patient Instructions (Signed)
Prednisone 5 mg pills: 3 pills daily Call later this week to give me an update about status Will arrange for rheumatology evaluation Follow up in 4 weeks

## 2011-04-13 ENCOUNTER — Telehealth: Payer: Self-pay | Admitting: Pulmonary Disease

## 2011-04-13 DIAGNOSIS — R0600 Dyspnea, unspecified: Secondary | ICD-10-CM

## 2011-04-13 NOTE — Telephone Encounter (Signed)
Would cont on prednisone, take 20mg  today and tomorrow then back to 15mg  per dr Craige Cotta recs Please contact office for sooner follow up if symptoms do not improve or worsen or seek emergency care  He was suppose to have had labs. ? Did he come and get these done if not needs to have them done asap

## 2011-04-13 NOTE — Telephone Encounter (Signed)
lmomtcb x1 

## 2011-04-13 NOTE — Telephone Encounter (Signed)
Spoke w/ pt and he states he is now having pain up around his neck. Pt states it hurts the most when he takes a deep breath. Pt states he is still having a hard time breathing when he is laying down. Pt is taking 5 mg prednisone 3 tablets a day. Pt states at first it didn't help at all but now it is helping some. Pt states he is not taking advil b/c he said the bottle advised not to take if he is taking prednisone. Pt is wanting to know what else to do. Since VS is not in the office will send to [facility] for any recs. Please advise Tammy. Thanks  Carver Fila, CMA

## 2011-04-13 NOTE — Telephone Encounter (Signed)
BNP added to labs that were ordered 5.3.12 per TP's request.

## 2011-04-13 NOTE — Telephone Encounter (Signed)
Spoke with pt and notified of recs per TP. Pt verbalized understanding and states that he will try to come to lab tomorrow or Monday to have labs drawn.

## 2011-04-17 ENCOUNTER — Ambulatory Visit: Payer: BC Managed Care – PPO | Admitting: Pulmonary Disease

## 2011-04-18 DIAGNOSIS — J45909 Unspecified asthma, uncomplicated: Secondary | ICD-10-CM | POA: Insufficient documentation

## 2011-04-18 NOTE — Assessment & Plan Note (Signed)
Will have him continue dulera, singulair, and as needed albuterol.

## 2011-04-19 ENCOUNTER — Telehealth: Payer: Self-pay | Admitting: *Deleted

## 2011-04-19 ENCOUNTER — Ambulatory Visit: Payer: BC Managed Care – PPO | Admitting: Pulmonary Disease

## 2011-04-19 NOTE — Telephone Encounter (Signed)
Message copied by Carver Fila on Wed Apr 19, 2011 12:55 PM ------      Message from: Coralyn Helling      Created: Tue Apr 18, 2011  5:02 PM       Can you confirm that Ryan Levy came in to get his labs done.  He needed to do the following: ANA, Rheumatoid factor, Anti-CCP, ANCA, and ACE.            He was supposed to also see rheumatology, so check to see if rheumatology hasn't had him do labs.  If he did get labs with rheumatology, then please have these labs sent to our office.  Will then decide what other labs to repeat.  Thanks.

## 2011-04-19 NOTE — Telephone Encounter (Signed)
Spoke w/ pt and he states he did not come and get labs done yet and doesn't know when this week he can come. I advised pt he needs to come this week and had these done ASAP since he was already suppose to have these done. Pt verbalized understanding and states he will try to come this week. Pt states Rheumatology did do labs. Called Dr. Shawnee Knapp office [phone] to get these labs faxed over but office was closed for lunch and had to leave message for Misty Stanley to fax labs over.

## 2011-04-21 NOTE — Telephone Encounter (Signed)
ATC Dr. Shawnee Knapp office again to get them to fax labs over but office was closed. Will have to call back on Tuesdsay

## 2011-05-03 ENCOUNTER — Telehealth: Payer: Self-pay | Admitting: Pulmonary Disease

## 2011-05-03 NOTE — Telephone Encounter (Signed)
OPENED IN ERROR DR BEEKMAN PHONED TO HAVE DR SOOD CALL HIM BACK REGARDING THIS PATIENT WE REFERRED HIM TO DR Dierdre Forth AND HE JUST WANTED TO GET SOME INFO PRIOR TO PTS  APPT. INFO GIVEN TO DR SOOD.[place]el

## 2011-05-16 NOTE — Telephone Encounter (Signed)
LMOM for Misty Stanley in medical records at Dr. Shawnee Knapp office regarding labs for this pt. Will await fax or callback.

## 2011-05-16 NOTE — Telephone Encounter (Signed)
Labs received and placed in Dr. Evlyn Courier look at for review.

## 2011-05-19 ENCOUNTER — Telehealth: Payer: Self-pay | Admitting: Pulmonary Disease

## 2011-05-19 MED ORDER — PREDNISONE 5 MG PO TABS: ORAL_TABLET | ORAL | Status: DC

## 2011-05-19 NOTE — Telephone Encounter (Signed)
Pt aware refill sent to pharmacy 

## 2011-08-02 ENCOUNTER — Telehealth: Payer: Self-pay | Admitting: Pulmonary Disease

## 2011-08-02 MED ORDER — PREDNISONE 5 MG PO TABS: ORAL_TABLET | ORAL | Status: AC

## 2011-08-02 NOTE — Telephone Encounter (Signed)
I spoke with pt and he is coming in 9/27 at 4:30 for f/u. Pt aware rx for prednisone was sent to the pharmacy

## 2011-08-02 NOTE — Telephone Encounter (Signed)
Lmomtcb. Pt last ov 04/10/11 and  f/u 4 weeks. No OV was made.

## 2011-08-24 ENCOUNTER — Ambulatory Visit: Payer: BC Managed Care – PPO | Admitting: Pulmonary Disease

## 2011-09-22 ENCOUNTER — Ambulatory Visit: Payer: BC Managed Care – PPO | Admitting: Pulmonary Disease

## 2012-03-14 ENCOUNTER — Ambulatory Visit (INDEPENDENT_AMBULATORY_CARE_PROVIDER_SITE_OTHER): Payer: BC Managed Care – PPO | Admitting: Adult Health

## 2012-03-14 ENCOUNTER — Other Ambulatory Visit (INDEPENDENT_AMBULATORY_CARE_PROVIDER_SITE_OTHER): Payer: BC Managed Care – PPO

## 2012-03-14 ENCOUNTER — Ambulatory Visit (INDEPENDENT_AMBULATORY_CARE_PROVIDER_SITE_OTHER)
Admission: RE | Admit: 2012-03-14 | Discharge: 2012-03-14 | Disposition: A | Discharge status: Home / Self Care | Payer: BC Managed Care – PPO | Source: Ambulatory Visit | Attending: Adult Health | Admitting: Adult Health

## 2012-03-14 ENCOUNTER — Encounter: Payer: Self-pay | Admitting: Adult Health

## 2012-03-14 VITALS — BP 134/78 | HR 71 | Temp 97.8°F | Ht 71.0 in | Wt 248.1 lb

## 2012-03-14 DIAGNOSIS — R509 Fever, unspecified: Secondary | ICD-10-CM

## 2012-03-14 DIAGNOSIS — R05 Cough: Secondary | ICD-10-CM

## 2012-03-14 DIAGNOSIS — R059 Cough, unspecified: Secondary | ICD-10-CM

## 2012-03-14 DIAGNOSIS — R0609 Other forms of dyspnea: Secondary | ICD-10-CM

## 2012-03-14 DIAGNOSIS — R0600 Dyspnea, unspecified: Secondary | ICD-10-CM

## 2012-03-14 DIAGNOSIS — R091 Pleurisy: Secondary | ICD-10-CM

## 2012-03-14 DIAGNOSIS — R0989 Other specified symptoms and signs involving the circulatory and respiratory systems: Secondary | ICD-10-CM

## 2012-03-14 DIAGNOSIS — J45909 Unspecified asthma, uncomplicated: Secondary | ICD-10-CM

## 2012-03-14 LAB — CBC WITH DIFFERENTIAL/PLATELET
Basophils Relative: 0.7 % (ref 0.0–3.0)
Eosinophils Absolute: 0.1 10*3/uL (ref 0.0–0.7)
HCT: 41.4 % (ref 39.0–52.0)
Hemoglobin: 13.6 g/dL (ref 13.0–17.0)
Lymphocytes Relative: 27.5 % (ref 12.0–46.0)
Lymphs Abs: 3 10*3/uL (ref 0.7–4.0)
MCHC: 32.7 g/dL (ref 30.0–36.0)
MCV: 86.6 fl (ref 78.0–100.0)
Monocytes Absolute: 0.9 10*3/uL (ref 0.1–1.0)
Neutro Abs: 6.8 10*3/uL (ref 1.4–7.7)
RBC: 4.78 Mil/uL (ref 4.22–5.81)

## 2012-03-14 MED ORDER — ALBUTEROL SULFATE HFA 108 (90 BASE) MCG/ACT IN AERS: 2.0000 | INHALATION_SPRAY | Freq: Four times a day (QID) | RESPIRATORY_TRACT | Status: AC | PRN

## 2012-03-14 MED ORDER — ALBUTEROL SULFATE (2.5 MG/3ML) 0.083% IN NEBU: 2.5000 mg | INHALATION_SOLUTION | RESPIRATORY_TRACT | Status: AC | PRN

## 2012-03-14 NOTE — Patient Instructions (Signed)
Restart  Dulera 2 puffs Twice daily   Zyrtec 10mg  At bedtime   follow up Dr. Craige Cotta  In 2  weeks and As needed   Please contact office for sooner follow up if symptoms do not improve or worsen or seek emergency care   I will call with xray and lab results

## 2012-03-14 NOTE — Assessment & Plan Note (Signed)
Check xray and labs w/ ESR.  No change in steroid dose at present time  follow up Dr. Craige Cotta  In 2 weeks

## 2012-03-14 NOTE — Assessment & Plan Note (Signed)
Restart  Dulera 2 puffs Twice daily   Zyrtec 10mg At bedtime   follow up Dr. Sood  In 2  weeks and As needed   Please contact office for sooner follow up if symptoms do not improve or worsen or seek emergency care   I will call with xray and lab results  

## 2012-03-14 NOTE — Progress Notes (Signed)
Addended by: Boone Master E on: 03/14/2012 05:09 PM   Modules accepted: Orders

## 2012-03-14 NOTE — Progress Notes (Signed)
Subjective:    Patient ID: Ryan Levy, male    DOB: 08/11/1986, 26 y.o.   MRN: [id number]  HPI 26 yo male with asthma, pleurisy, and fever.  03/14/2012 Acute OV  Patient presents for an acute office visit. Patient has a history of asthma, and persistent pleurisy with associated pulmonary infiltrates and elevated sedimentation rate. Patient had been referred to rheumatology for further evaluation and was seen by Dr. Dierdre Forth. His pain was responsive to steroid therapy. Chest CT showed patchy areas of groundglass attenuation thought to be secondary to edema or atelectasis. This was done in May of 2012. He informs me that his symptoms did improve however, he continued to have have recurrent pain, and was referred to [facility]. Unfortunately, his records from [facility] are not available. He informs me that over the last 6 months that he has been on a tapering dose of prednisone and was down to 1 mg daily. He tapered himself off of this, a month ago, however, his pleuritic pain, returned. Over the last 3 weeks has noticed increased shortness of breath and pleuritic pain. He has stopped his due later inhaler as well. He does have some nasal drainage, and feels the pollen is to blame. He has restarted his prednisone at 3 mg daily. Currently. He denies any hemoptysis, discolored mucus, fever or edema. He denies any rash, joint swelling, nausea, vomiting, or diarrhea.   Past Medical History  Diagnosis Date  . Asthma      Family History  Problem Relation Age of Onset  . Asthma Maternal Grandfather      History   Social History  . Marital Status: Single    Spouse Name: N/A    Number of Children: N/A  . Years of Education: N/A   Occupational History  . Banker    Social History Main Topics  . Smoking status: Never Smoker   . Smokeless tobacco: Never Used  . Alcohol Use: No     social  . Drug Use: No  . Sexually Active: Not on file   Other Topics Concern  . Not on file   Social  History Narrative  . No narrative on file     No Known Allergies   Outpatient Prescriptions Prior to Visit  Medication Sig Dispense Refill  . albuterol (PROAIR HFA) 108 (90 BASE) MCG/ACT inhaler Inhale 2 puffs into the lungs every 6 (six) hours as needed.        Marland Kitchen albuterol (PROVENTIL) (2.5 MG/3ML) 0.083% nebulizer solution Take 1 vial by nebulization Every 4 hours as needed.      Marland Kitchen ibuprofen (ADVIL,MOTRIN) 200 MG tablet As needed       . Mometasone Furo-Formoterol Fum (DULERA) 200-5 MCG/ACT AERO Take 2 puffs first thing in am and then another 2 puffs about 12 hours later.         . montelukast (SINGULAIR) 10 MG tablet Take 10 mg by mouth daily.        . ranitidine (ZANTAC) 150 MG tablet Take by mouth as needed.       Review of Systems Constitutional:   No  weight loss, night sweats,  Fevers, chills,  +fatigue, or  lassitude.  HEENT:   No headaches,  Difficulty swallowing,  Tooth/dental problems, or  Sore throat,                No sneezing, itching, ear ache, nasal congestion, post nasal drip,   CV:  No    Orthopnea, PND, swelling in  lower extremities, anasarca, dizziness, palpitations, syncope.   GI  No heartburn, indigestion, abdominal pain, nausea, vomiting, diarrhea, change in bowel habits, loss of appetite, bloody stools.   Resp:  No excess mucus, no productive cough,  No non-productive cough,  No coughing up of blood.  No change in color of mucus.  No wheezing.  No chest wall deformity  Skin: no rash or lesions.  GU: no dysuria, change in color of urine, no urgency or frequency.  No flank pain, no hematuria   MS:  No joint pain or swelling.  No decreased range of motion.  No back pain.  Psych:  No change in mood or affect. No depression or anxiety.  No memory loss.         Objective:   Physical Exam Filed Vitals:   03/14/12 1628  BP: 134/78  Pulse: 71  Temp: 97.8 F (36.6 C)  TempSrc: Oral  Height: 5\' 11"  (1.803 m)  Weight: 248 lb 1.6 oz (112.537 kg)  SpO2:  99%   GEN: A/Ox3; pleasant , NAD, well nourished   HEENT:  [place]/AT,  EACs-clear, TMs-wnl, NOSE-clear, THROAT-clear, no lesions, no postnasal drip or exudate noted.   NECK:  Supple w/ fair ROM; no JVD; normal carotid impulses w/o bruits; no thyromegaly or nodules palpated; no lymphadenopathy.  RESP  Clear  P & A; w/o, wheezes/ rales/ or rhonchi.no accessory muscle use, no dullness to percussion  CARD:  RRR, no m/r/g  , no peripheral edema, pulses intact, no cyanosis or clubbing.  GI:   Soft & nt; nml bowel sounds; no organomegaly or masses detected.  Musco: Warm bil, no deformities or joint swelling noted.   Neuro: alert, no focal deficits noted.    Skin: Warm, no lesions or rashes      Assessment & Plan:   No problem-specific assessment & plan notes found for this encounter.   Updated Medication List Outpatient Encounter Prescriptions as of 03/14/2012  Medication Sig Dispense Refill  . albuterol (PROAIR HFA) 108 (90 BASE) MCG/ACT inhaler Inhale 2 puffs into the lungs every 6 (six) hours as needed.        Marland Kitchen albuterol (PROVENTIL) (2.5 MG/3ML) 0.083% nebulizer solution Take 1 vial by nebulization Every 4 hours as needed.      . predniSONE (DELTASONE) 1 MG tablet Take 3 tablets by mouth daily.      Marland Kitchen DISCONTD: ibuprofen (ADVIL,MOTRIN) 200 MG tablet As needed       . Mometasone Furo-Formoterol Fum (DULERA) 200-5 MCG/ACT AERO Take 2 puffs first thing in am and then another 2 puffs about 12 hours later.         . montelukast (SINGULAIR) 10 MG tablet Take 10 mg by mouth daily.        . ranitidine (ZANTAC) 150 MG tablet Take by mouth as needed.

## 2012-03-15 NOTE — Progress Notes (Signed)
Reviewed and agree with assessment/plan. 

## 2012-03-18 ENCOUNTER — Telehealth: Payer: Self-pay | Admitting: Adult Health

## 2012-03-18 NOTE — Telephone Encounter (Signed)
LMOM TCB x2  Result Notes     Notes Recorded by Sherre Lain, MA on 03/15/2012 at 5:08 PM LMOM TCB x1.  Patient Instructions  Restart Dulera 2 puffs Twice daily  Zyrtec 10mg  At bedtime  follow up Dr. Craige Cotta In 2 weeks and As needed  ------ Notes Recorded by Julio Sicks, NP on 03/14/2012 at 5:26 PM Lungs clear  Cont w/ ov recs  follow up with Dr. Craige Cotta As planned and As needed

## 2012-03-19 ENCOUNTER — Telehealth: Payer: Self-pay | Admitting: Pulmonary Disease

## 2012-03-19 NOTE — Telephone Encounter (Signed)
PT given results of CXR per Tammy Parrett.

## 2012-03-19 NOTE — Telephone Encounter (Signed)
Per 4.18.13 result note:  Result Note     ESR -inflammatory marker is much lower  Cont on current regimen follow up Dr. Craige Cotta In 2 weeks as planned and As needed   ------------------ Called spoke with patient, advised of lab results / recs as stated by TP.  Pt verbalized his understanding and will keep 5.13.13 appt with VS.

## 2012-04-08 ENCOUNTER — Ambulatory Visit: Payer: BC Managed Care – PPO | Admitting: Pulmonary Disease

## 2012-05-13 ENCOUNTER — Ambulatory Visit: Payer: BC Managed Care – PPO | Admitting: Pulmonary Disease

## 2013-02-25 IMAGING — CR DG CHEST 2V
2 series · 2 of 2 positions shown · non-contrast
Comparison: None

CLINICAL DATA: Chest pain and short of breath

CHEST - 2 VIEW

[w chest pa]
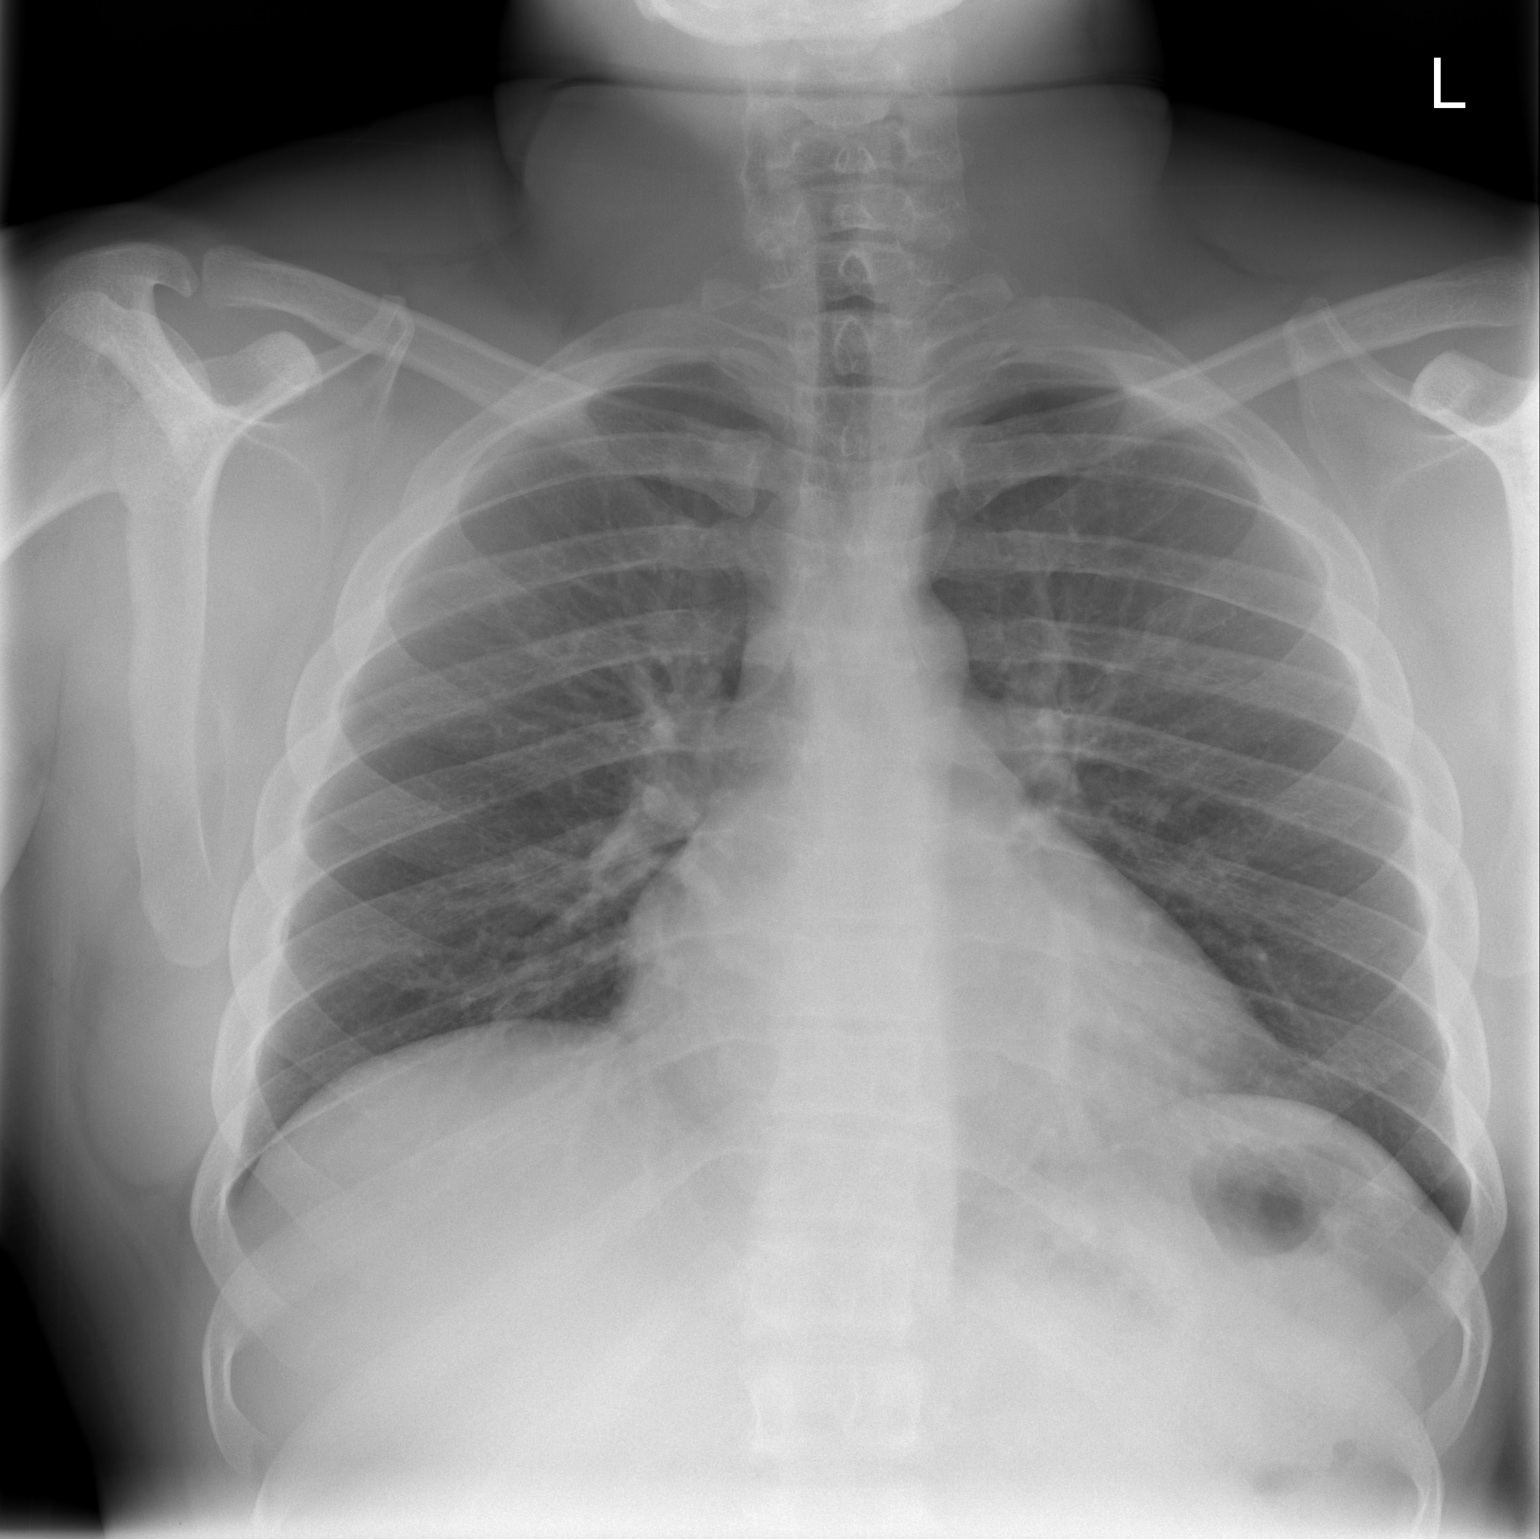

[w chest lat]
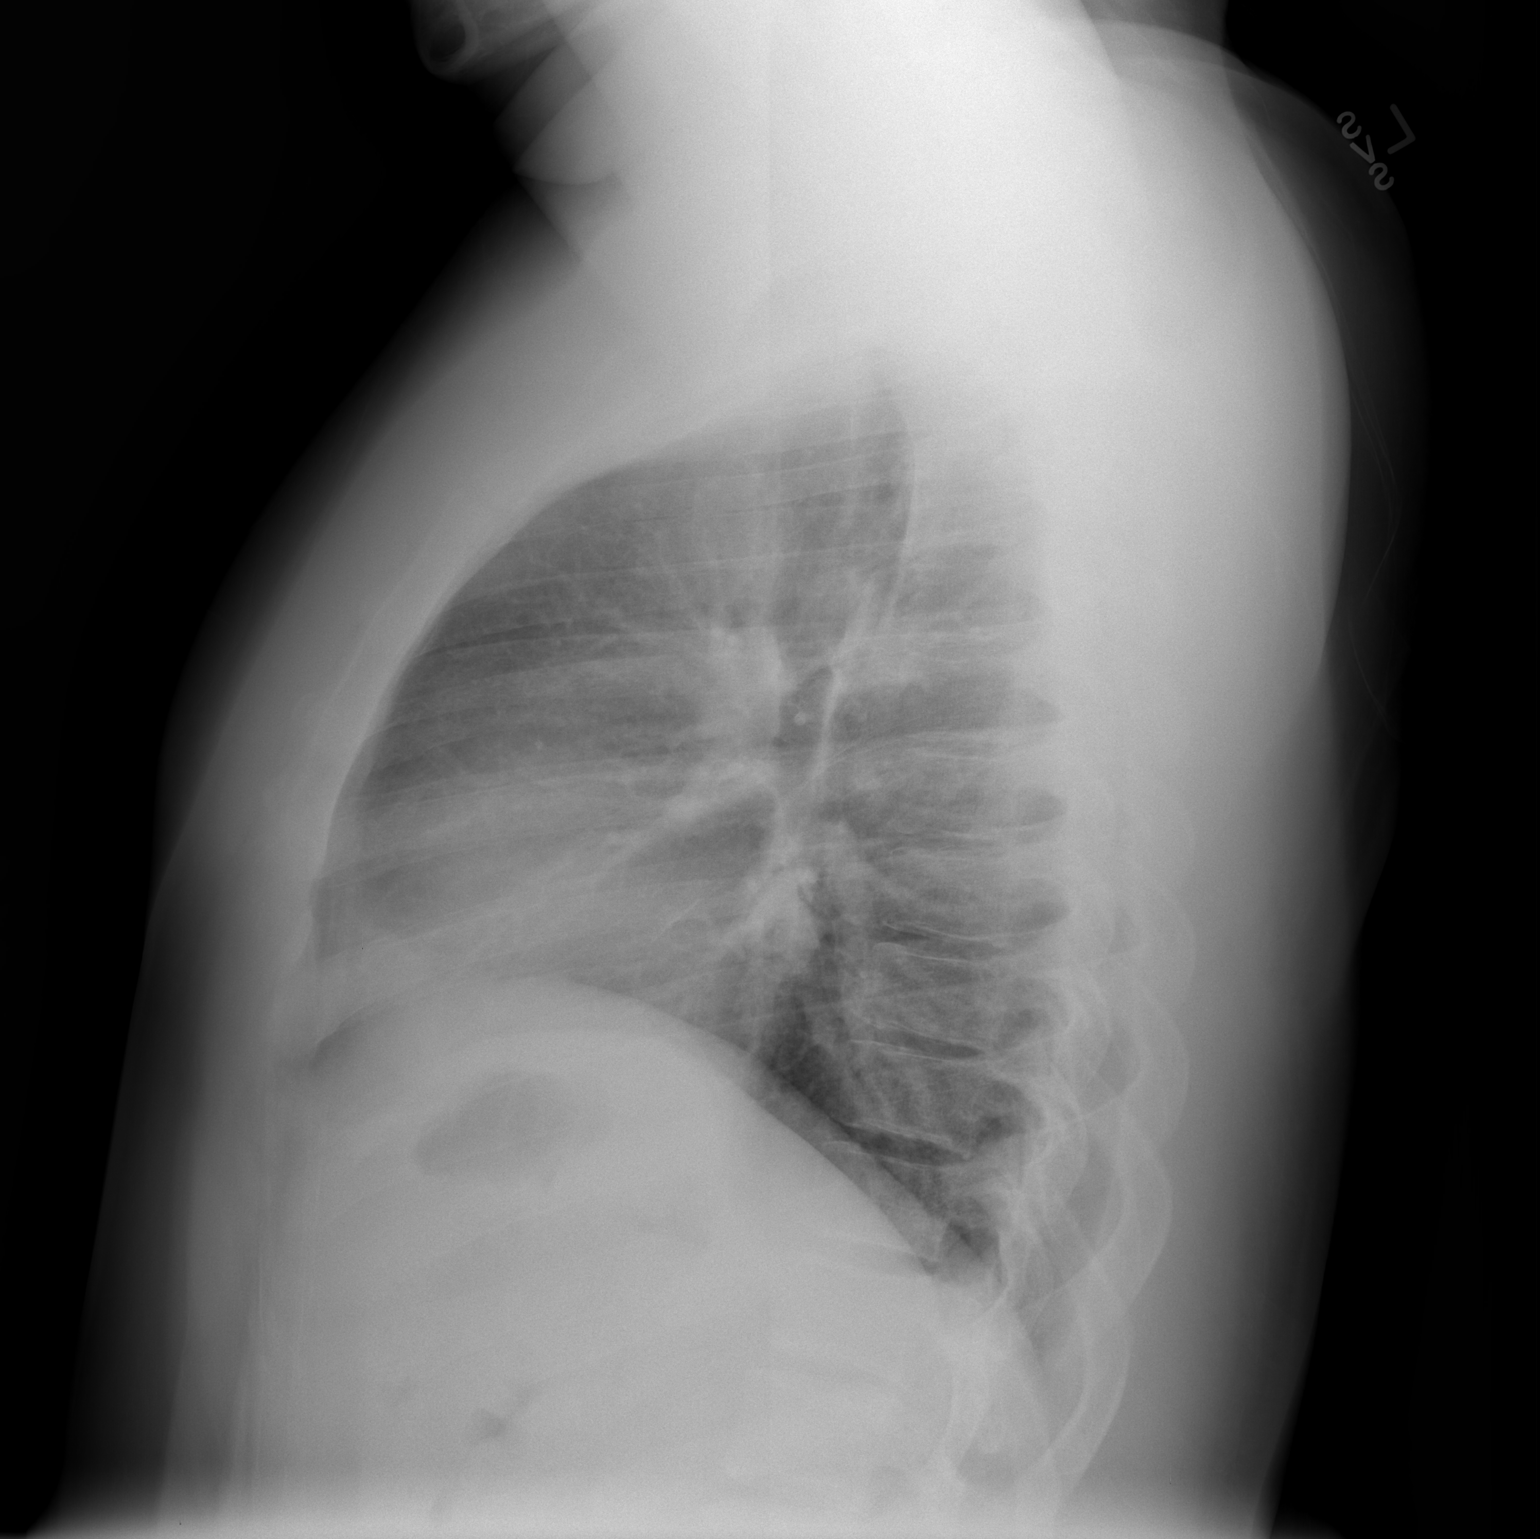

[2 of 2 positions shown; findings below may reference images not displayed]

FINDINGS: Heart size mildly enlarged.  Negative for heart failure.
Lungs are clear without infiltrate or effusion.  Negative for mass
lesion.
IMPRESSION: Mild cardiac enlargement.  No acute abnormality.

## 2015-12-30 ENCOUNTER — Other Ambulatory Visit: Payer: Self-pay | Admitting: Family Medicine

## 2015-12-30 DIAGNOSIS — N509 Disorder of male genital organs, unspecified: Secondary | ICD-10-CM

## 2016-01-04 ENCOUNTER — Ambulatory Visit
Admission: RE | Admit: 2016-01-04 | Discharge: 2016-01-04 | Disposition: A | Discharge status: Home / Self Care | Payer: BLUE CROSS/BLUE SHIELD | Source: Ambulatory Visit | Attending: Family Medicine | Admitting: Family Medicine

## 2016-01-04 DIAGNOSIS — N509 Disorder of male genital organs, unspecified: Secondary | ICD-10-CM

## 2016-01-07 ENCOUNTER — Other Ambulatory Visit: Payer: Self-pay

## 2018-01-06 IMAGING — US US SCROTUM
1 series · 14 of 25 positions shown · non-contrast
Comparison: None.

CLINICAL DATA: Right-sided testicular lump for 2 weeks.

EXAM:
ULTRASOUND OF SCROTUM
TECHNIQUE: Complete ultrasound examination of the testicles, epididymis, and
other scrotal structures was performed.

[Series 1: us scrotum · 14 of 41 slices shown]
[im 1/41]
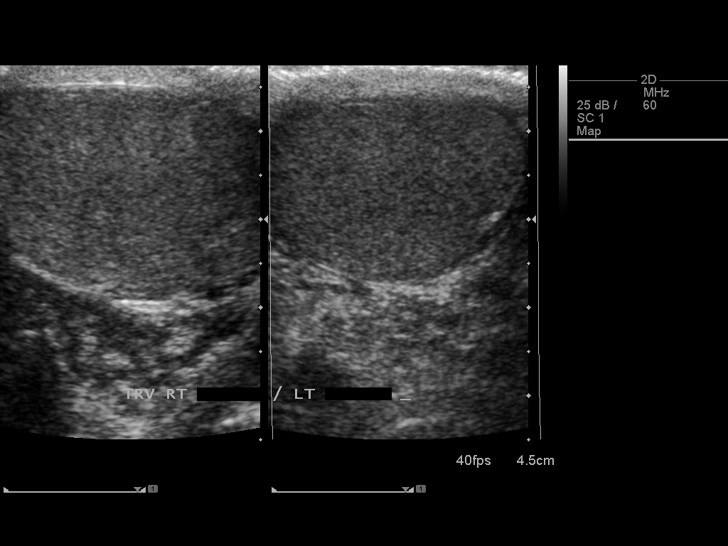
[im 4/41]
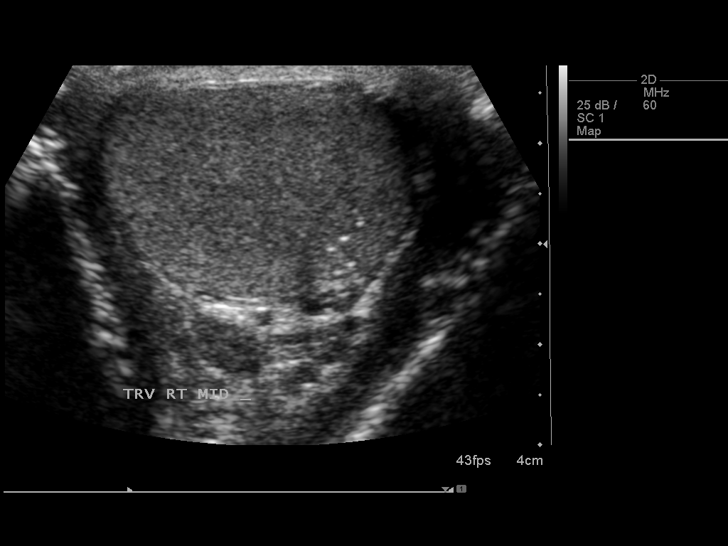
[im 7/41]
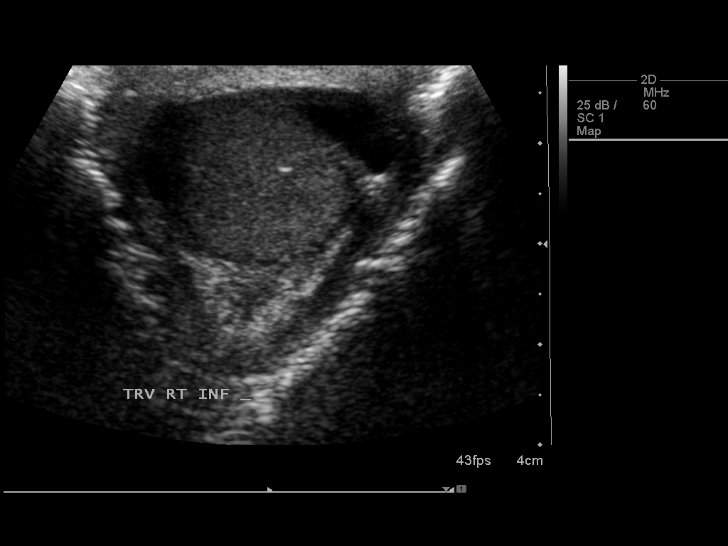
[im 11/41]
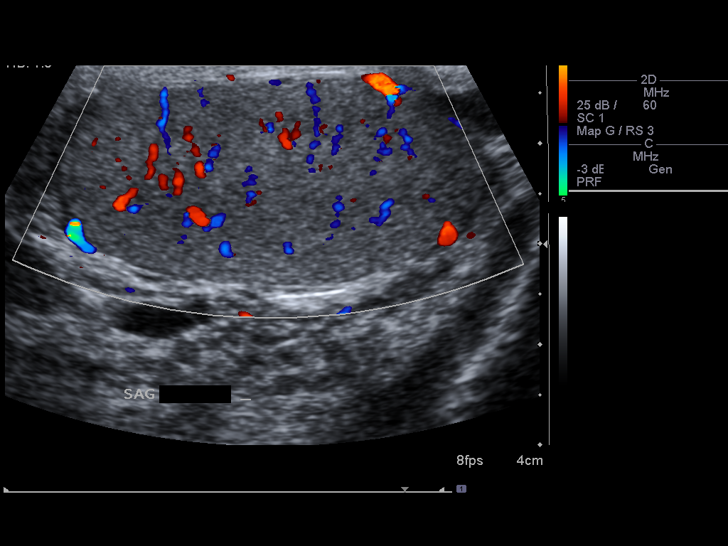
[im 14/41]
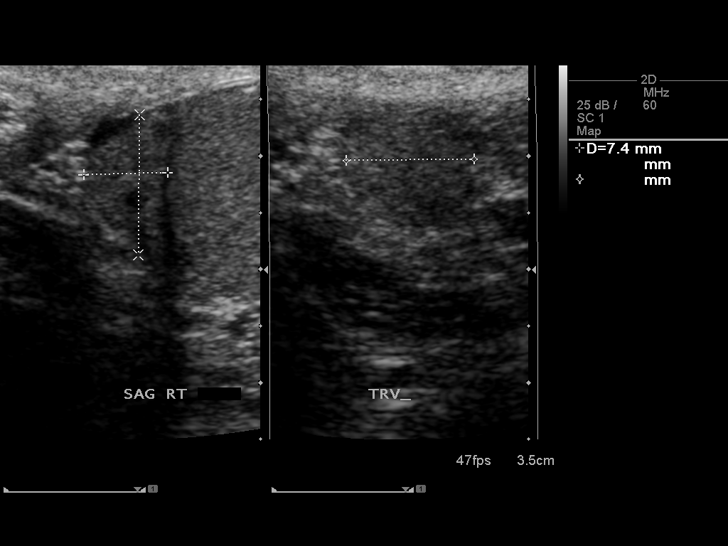
[im 16/41]
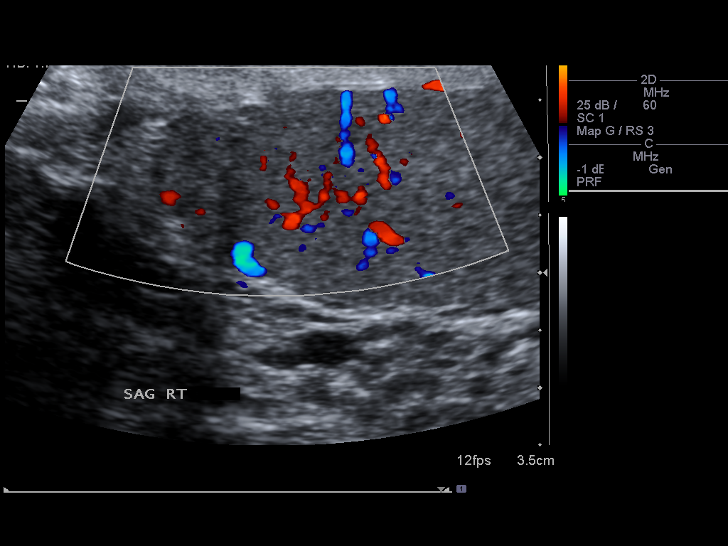
[im 19/41]
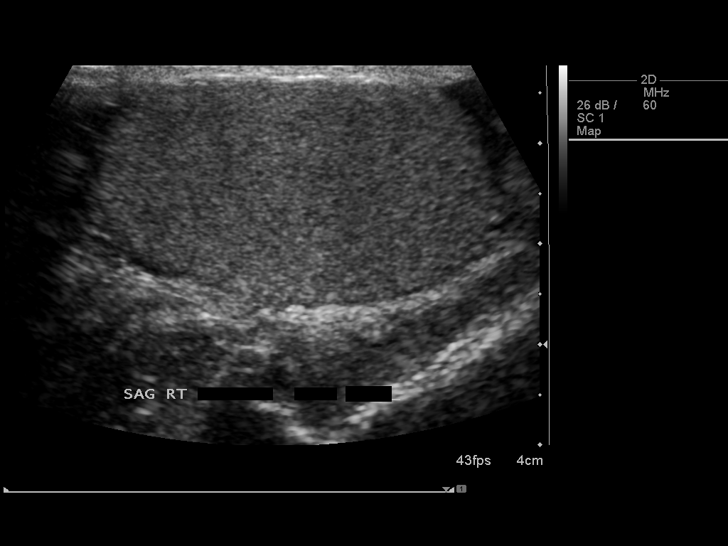
[im 22/41]
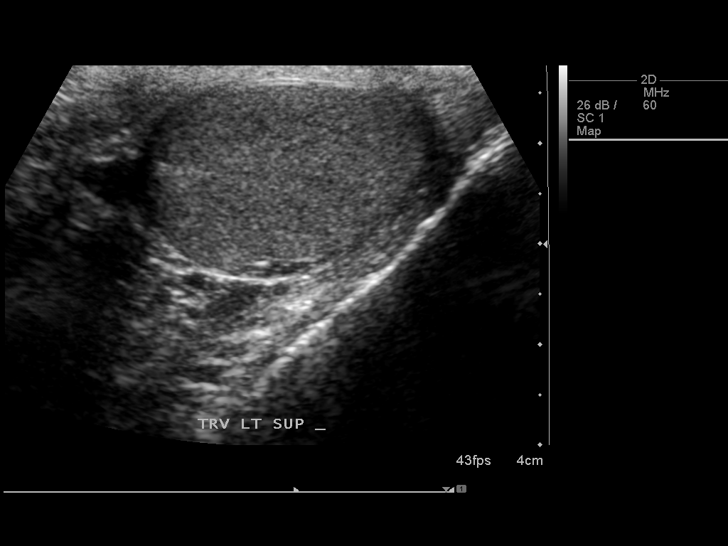
[im 26/41]
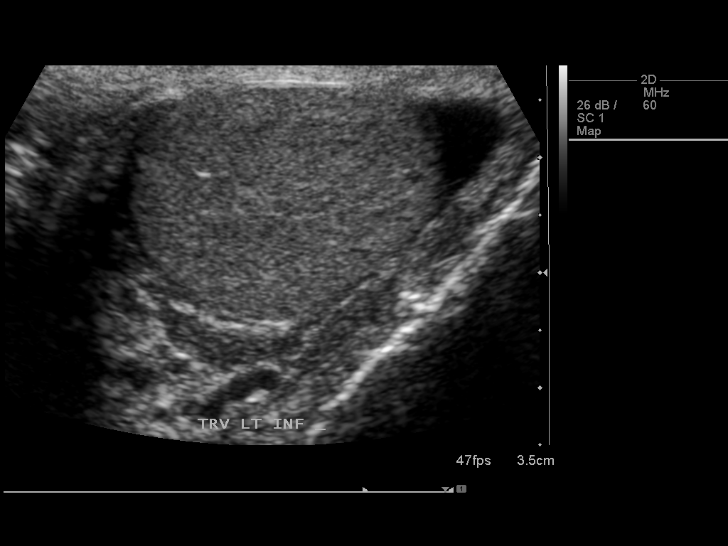
[im 27/41]
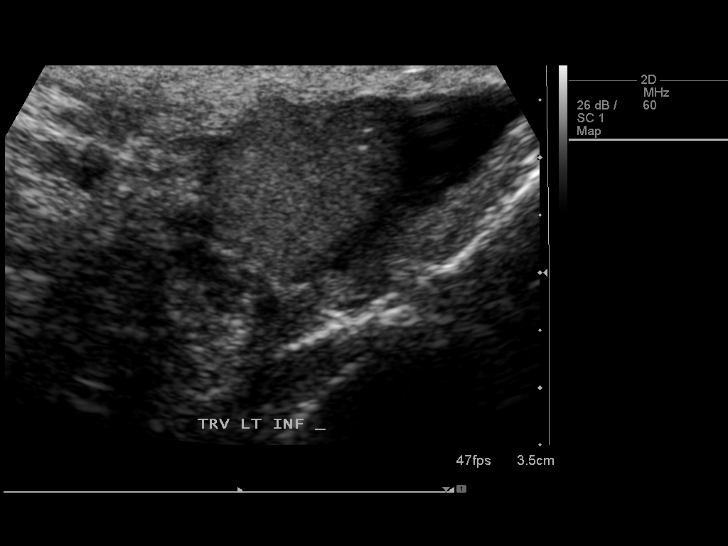
[im 31/41]
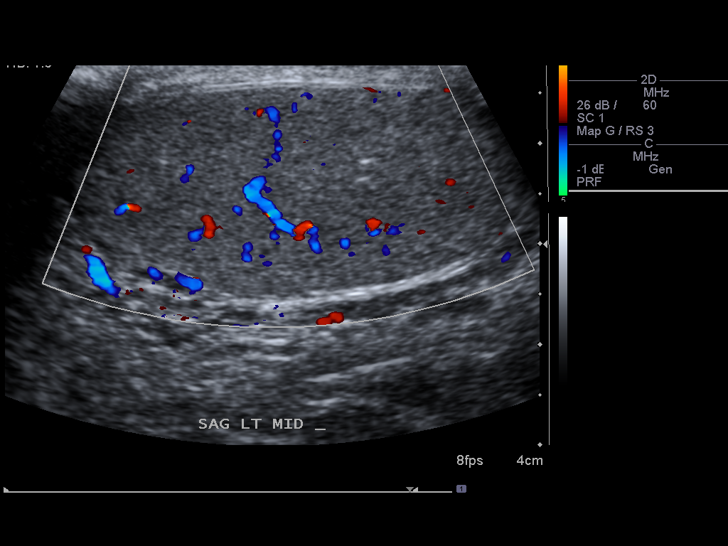
[im 34/41]
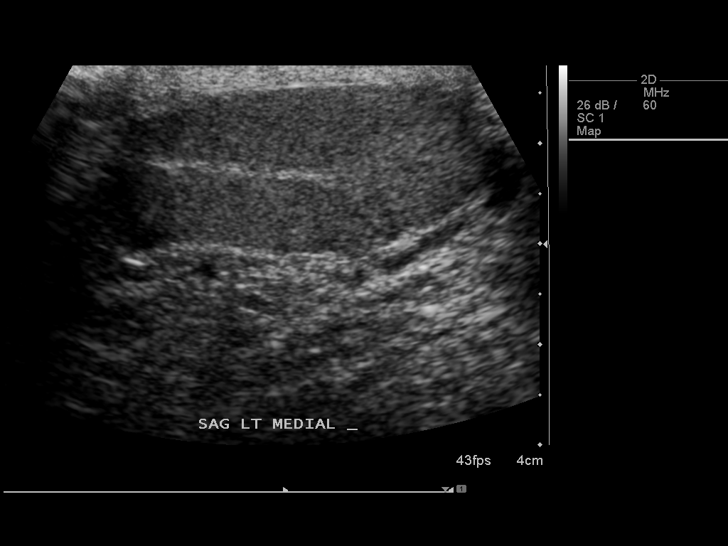
[im 37/41]
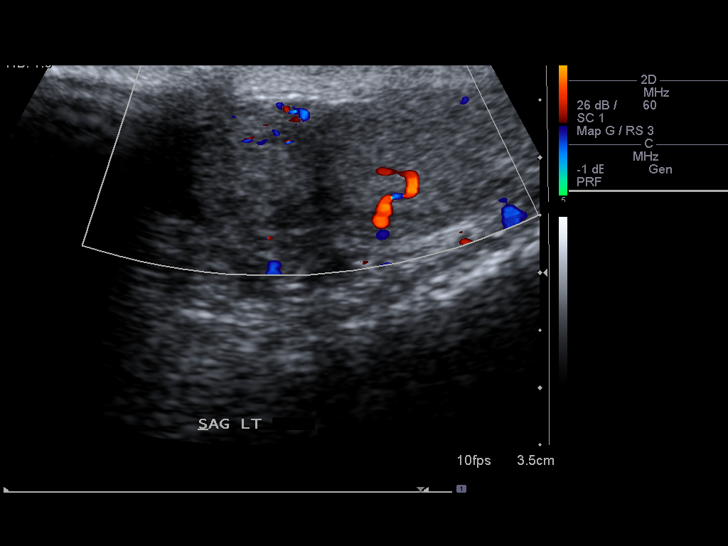
[im 41/41]
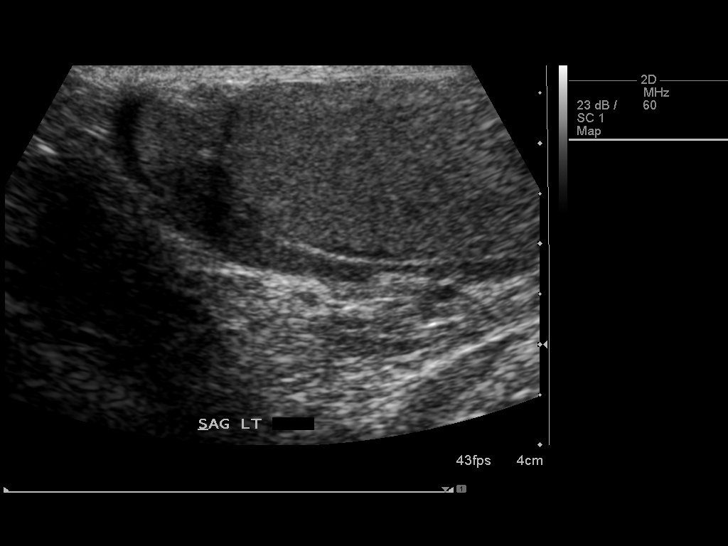

[14 of 25 positions shown; findings below may reference images not displayed]

FINDINGS: Right testicle

Measurements: 4.3 x 2.2 x 3.0 cm.  Normal in morphology.

Left testicle

Measurements:  4.6 x 2.1 x 2.8 cm.  Normal in morphology.

Right epididymis:  Normal in size and appearance.

Left epididymis:  Normal in size and appearance.

Hydrocele:  Minimal left hemiscrotal fluid is likely physiologic.

Varicocele:  None visualized.
IMPRESSION: Normal testicular ultrasound.  No evidence of testicular mass.

## 2019-08-26 DIAGNOSIS — E785 Hyperlipidemia, unspecified: Secondary | ICD-10-CM | POA: Diagnosis not present

## 2019-08-26 DIAGNOSIS — Z0000 Encounter for general adult medical examination without abnormal findings: Secondary | ICD-10-CM | POA: Diagnosis not present

## 2019-12-09 DIAGNOSIS — Z6836 Body mass index (BMI) 36.0-36.9, adult: Secondary | ICD-10-CM | POA: Diagnosis not present

## 2019-12-09 DIAGNOSIS — Z20828 Contact with and (suspected) exposure to other viral communicable diseases: Secondary | ICD-10-CM | POA: Diagnosis not present

## 2020-06-16 DIAGNOSIS — R079 Chest pain, unspecified: Secondary | ICD-10-CM | POA: Diagnosis not present

## 2020-06-18 DIAGNOSIS — M531 Cervicobrachial syndrome: Secondary | ICD-10-CM | POA: Diagnosis not present

## 2020-06-18 DIAGNOSIS — M9901 Segmental and somatic dysfunction of cervical region: Secondary | ICD-10-CM | POA: Diagnosis not present

## 2020-06-18 DIAGNOSIS — M9902 Segmental and somatic dysfunction of thoracic region: Secondary | ICD-10-CM | POA: Diagnosis not present

## 2020-06-18 DIAGNOSIS — M5386 Other specified dorsopathies, lumbar region: Secondary | ICD-10-CM | POA: Diagnosis not present

## 2020-06-21 DIAGNOSIS — M9902 Segmental and somatic dysfunction of thoracic region: Secondary | ICD-10-CM | POA: Diagnosis not present

## 2020-06-21 DIAGNOSIS — M5386 Other specified dorsopathies, lumbar region: Secondary | ICD-10-CM | POA: Diagnosis not present

## 2020-06-21 DIAGNOSIS — M531 Cervicobrachial syndrome: Secondary | ICD-10-CM | POA: Diagnosis not present

## 2020-06-21 DIAGNOSIS — M9901 Segmental and somatic dysfunction of cervical region: Secondary | ICD-10-CM | POA: Diagnosis not present

## 2020-06-28 DIAGNOSIS — M9902 Segmental and somatic dysfunction of thoracic region: Secondary | ICD-10-CM | POA: Diagnosis not present

## 2020-06-28 DIAGNOSIS — M531 Cervicobrachial syndrome: Secondary | ICD-10-CM | POA: Diagnosis not present

## 2020-06-28 DIAGNOSIS — M5386 Other specified dorsopathies, lumbar region: Secondary | ICD-10-CM | POA: Diagnosis not present

## 2020-06-28 DIAGNOSIS — M9901 Segmental and somatic dysfunction of cervical region: Secondary | ICD-10-CM | POA: Diagnosis not present

## 2020-07-16 DIAGNOSIS — Z3141 Encounter for fertility testing: Secondary | ICD-10-CM | POA: Diagnosis not present

## 2021-04-05 ENCOUNTER — Ambulatory Visit (INDEPENDENT_AMBULATORY_CARE_PROVIDER_SITE_OTHER): Payer: PRIVATE HEALTH INSURANCE | Admitting: Otolaryngology

## 2021-04-05 ENCOUNTER — Other Ambulatory Visit: Payer: Self-pay

## 2021-04-05 ENCOUNTER — Encounter (INDEPENDENT_AMBULATORY_CARE_PROVIDER_SITE_OTHER): Payer: Self-pay | Admitting: Otolaryngology

## 2021-04-05 VITALS — Temp 97.3°F

## 2021-04-05 DIAGNOSIS — R490 Dysphonia: Secondary | ICD-10-CM | POA: Diagnosis not present

## 2021-04-05 DIAGNOSIS — J381 Polyp of vocal cord and larynx: Secondary | ICD-10-CM | POA: Diagnosis not present

## 2021-04-05 NOTE — Progress Notes (Signed)
HPI: Ryan Levy is a 35 y.o. male who presents is referred by his PCP for evaluation of hoarseness.  Patient states that his wife first noticed his hoarseness in January.  He states that it got bad in March but has gradually gotten little bit better since that time.  He is able to talk but has a raspy voice.  Denies any sore throat.  He does not smoke.  He is not sure what caused the hoarseness. He does not excessively use his voice. He has history of asthma and allergies.  Past Medical History:  Diagnosis Date  . Asthma    No past surgical history on file. Social History   Socioeconomic History  . Marital status: Single    Spouse name: Not on file  . Number of children: Not on file  . Years of education: Not on file  . Highest education level: Not on file  Occupational History  . Occupation: Banker  Tobacco Use  . Smoking status: Never Smoker  . Smokeless tobacco: Never Used  Substance and Sexual Activity  . Alcohol use: No    Comment: social  . Drug use: No  . Sexual activity: Not on file  Other Topics Concern  . Not on file  Social History Narrative  . Not on file   Social Determinants of Health   Financial Resource Strain: Not on file  Food Insecurity: Not on file  Transportation Needs: Not on file  Physical Activity: Not on file  Stress: Not on file  Social Connections: Not on file   Family History  Problem Relation Age of Onset  . Asthma Maternal Grandfather    No Known Allergies Prior to Admission medications   Medication Sig Start Date End Date Taking? Authorizing Provider  albuterol (PROAIR HFA) 108 (90 BASE) MCG/ACT inhaler Inhale 2 puffs into the lungs every 6 (six) hours as needed for wheezing or shortness of breath. 03/14/12   Parrett, Virgel Bouquet, NP  albuterol (PROVENTIL) (2.5 MG/3ML) 0.083% nebulizer solution Take 3 mLs (2.5 mg total) by nebulization every 4 (four) hours as needed for wheezing or shortness of breath. 03/14/12   Parrett, Virgel Bouquet, NP   Mometasone Furo-Formoterol Fum (DULERA) 200-5 MCG/ACT AERO Take 2 puffs first thing in am and then another 2 puffs about 12 hours later.    03/14/11   Nyoka Cowden, MD  montelukast (SINGULAIR) 10 MG tablet Take 10 mg by mouth daily.      [provider]  predniSONE (DELTASONE) 1 MG tablet Take 3 tablets by mouth daily. 01/09/12   [provider]  ranitidine (ZANTAC) 150 MG tablet Take by mouth as needed. 03/21/11   [provider]     Positive ROS: Otherwise negative  All other systems have been reviewed and were otherwise negative with the exception of those mentioned in the HPI and as above.  Physical Exam: Constitutional: Alert, well-appearing, no acute distress.  Patient has mild to moderate hoarseness with a raspy voice. Ears: External ears without lesions or tenderness. Ear canals are clear bilaterally with intact, clear TMs.  Nasal: External nose without lesions. Septum mildly deviated to the right.. Clear nasal passages otherwise. Oral: Lips and gums without lesions. Tongue and palate mucosa without lesions. Posterior oropharynx clear.  Polyps are symmetric.  He has a small papilloma on the right superior tonsil.  Indirect laryngoscopy revealed prominent lingual tonsil tissue.  Epiglottis was normal.  On evaluation of vocal cords he appears to have a left vocal  cord polyp. Fiberoptic laryngoscopy was performed to the left nostril.  The nasopharynx was clear.  He had clear epiglottis.  Vocal cords had symmetric mobility but again noted is small left vocal cord polypoid lesion that appears benign. Neck: No palpable adenopathy or masses Respiratory: Breathing comfortably  Skin: No facial/neck lesions or rash noted.  Laryngoscopy  Date/Time: 04/05/2021 5:53 PM Performed by: Drema Halon, MD Authorized by: Drema Halon, MD   Consent:    Consent obtained:  Verbal   Consent given by:  Patient Procedure details:    Indications:  hoarseness, dysphagia, or aspiration     Medication:  Afrin   Instrument: flexible fiberoptic laryngoscope     Scope location: left nare   Sinus:    Left nasopharynx: normal   Mouth:    Oropharynx: normal     Vallecula: normal     Base of tongue: normal     Epiglottis: normal   Throat:    True vocal cords: normal   Comments:     On fiberoptic laryngoscopy patient has a polyp on the left true vocal cord which is contributing to his hoarseness    Assessment: 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  Plan: Reviewed with the patient today that he has a polyp in the left vocal cord which is contributing to his hoarseness. I discussed with him that this appears benign but if his hoarseness worsens would recommend having this removed.  I reviewed with him concerning surgery to remove this would require general anesthesia and direct laryngoscopy for 30 minutes as an outpatient. Since he feels like his voice has been getting better he would like to observe this for now. He will call us back if the hoarseness persists or worsens.   Narda Bonds, MD   CC:

## 2021-06-30 ENCOUNTER — Telehealth (INDEPENDENT_AMBULATORY_CARE_PROVIDER_SITE_OTHER): Payer: Self-pay | Admitting: Otolaryngology

## 2021-06-30 ENCOUNTER — Encounter (INDEPENDENT_AMBULATORY_CARE_PROVIDER_SITE_OTHER): Payer: Self-pay

## 2021-06-30 ENCOUNTER — Other Ambulatory Visit (INDEPENDENT_AMBULATORY_CARE_PROVIDER_SITE_OTHER): Payer: Self-pay | Admitting: Otolaryngology

## 2021-06-30 DIAGNOSIS — R490 Dysphonia: Secondary | ICD-10-CM | POA: Diagnosis not present

## 2021-06-30 DIAGNOSIS — J381 Polyp of vocal cord and larynx: Secondary | ICD-10-CM | POA: Diagnosis not present

## 2021-06-30 MED ORDER — OMEPRAZOLE 40 MG PO CPDR: 40.0000 mg | DELAYED_RELEASE_CAPSULE | Freq: Every day | ORAL | 3 refills | Status: DC

## 2021-06-30 NOTE — Telephone Encounter (Signed)
After discussion with his wife following surgery I recommended taking omeprazole 40 mg daily before dinner for the next month and sent prescription into [facility] on [address] at her request.  He will follow-up in my office in 2 and half weeks for recheck.

## 2021-07-13 ENCOUNTER — Telehealth (INDEPENDENT_AMBULATORY_CARE_PROVIDER_SITE_OTHER): Payer: Self-pay | Admitting: Otolaryngology

## 2021-07-13 NOTE — Telephone Encounter (Signed)
Called patient today concerning results of the vocal cord biopsies performed 2 weeks ago.  Path report demonstrated benign squamous polyp on both the right and left true vocal cords with no dysplasia or malignancy identified.

## 2021-07-18 ENCOUNTER — Encounter (INDEPENDENT_AMBULATORY_CARE_PROVIDER_SITE_OTHER): Payer: No Typology Code available for payment source | Admitting: Otolaryngology

## 2021-07-19 ENCOUNTER — Encounter (INDEPENDENT_AMBULATORY_CARE_PROVIDER_SITE_OTHER): Payer: No Typology Code available for payment source | Admitting: Otolaryngology

## 2024-10-08 ENCOUNTER — Ambulatory Visit: Admitting: Family Medicine

## 2024-10-08 ENCOUNTER — Encounter: Payer: Self-pay | Admitting: Family Medicine

## 2024-10-08 VITALS — BP 124/84 | HR 60 | Temp 97.2°F | Resp 18 | Ht 71.0 in | Wt 263.0 lb

## 2024-10-08 DIAGNOSIS — Z0000 Encounter for general adult medical examination without abnormal findings: Secondary | ICD-10-CM | POA: Diagnosis not present

## 2024-10-08 DIAGNOSIS — Z6836 Body mass index (BMI) 36.0-36.9, adult: Secondary | ICD-10-CM | POA: Diagnosis not present

## 2024-10-08 DIAGNOSIS — Z130 Encounter for screening for diseases of the blood and blood-forming organs and certain disorders involving the immune mechanism: Secondary | ICD-10-CM

## 2024-10-08 DIAGNOSIS — K219 Gastro-esophageal reflux disease without esophagitis: Secondary | ICD-10-CM

## 2024-10-08 DIAGNOSIS — E66812 Obesity, class 2: Secondary | ICD-10-CM

## 2024-10-08 DIAGNOSIS — Z13228 Encounter for screening for other metabolic disorders: Secondary | ICD-10-CM

## 2024-10-08 DIAGNOSIS — Z1159 Encounter for screening for other viral diseases: Secondary | ICD-10-CM

## 2024-10-08 DIAGNOSIS — J4520 Mild intermittent asthma, uncomplicated: Secondary | ICD-10-CM

## 2024-10-08 DIAGNOSIS — Z114 Encounter for screening for human immunodeficiency virus [HIV]: Secondary | ICD-10-CM

## 2024-10-08 DIAGNOSIS — Z136 Encounter for screening for cardiovascular disorders: Secondary | ICD-10-CM

## 2024-10-08 DIAGNOSIS — Z1329 Encounter for screening for other suspected endocrine disorder: Secondary | ICD-10-CM | POA: Diagnosis not present

## 2024-10-08 DIAGNOSIS — Z1321 Encounter for screening for nutritional disorder: Secondary | ICD-10-CM

## 2024-10-08 DIAGNOSIS — Z7689 Persons encountering health services in other specified circumstances: Secondary | ICD-10-CM

## 2024-10-08 LAB — CBC WITH DIFFERENTIAL/PLATELET
Basophils Absolute: 0 K/uL (ref 0.0–0.1)
Basophils Relative: 0.3 % (ref 0.0–3.0)
Eosinophils Absolute: 0.1 K/uL (ref 0.0–0.7)
Eosinophils Relative: 0.9 % (ref 0.0–5.0)
HCT: 47.2 % (ref 39.0–52.0)
Hemoglobin: 15.5 g/dL (ref 13.0–17.0)
Lymphocytes Relative: 21.6 % (ref 12.0–46.0)
Lymphs Abs: 1.5 K/uL (ref 0.7–4.0)
MCHC: 32.8 g/dL (ref 30.0–36.0)
MCV: 86.9 fl (ref 78.0–100.0)
Monocytes Absolute: 0.6 K/uL (ref 0.1–1.0)
Monocytes Relative: 8.4 % (ref 3.0–12.0)
Neutro Abs: 4.8 K/uL (ref 1.4–7.7)
Neutrophils Relative %: 68.8 % (ref 43.0–77.0)
Platelets: 242 K/uL (ref 150.0–400.0)
RBC: 5.43 Mil/uL (ref 4.22–5.81)
RDW: 14.8 % (ref 11.5–15.5)
WBC: 7 K/uL (ref 4.0–10.5)

## 2024-10-08 LAB — COMPREHENSIVE METABOLIC PANEL WITH GFR
ALT: 20 U/L (ref 0–53)
AST: 21 U/L (ref 0–37)
Albumin: 4.7 g/dL (ref 3.5–5.2)
Alkaline Phosphatase: 61 U/L (ref 39–117)
BUN: 9 mg/dL (ref 6–23)
CO2: 31 meq/L (ref 19–32)
Calcium: 9.5 mg/dL (ref 8.4–10.5)
Chloride: 100 meq/L (ref 96–112)
Creatinine, Ser: 1.09 mg/dL (ref 0.40–1.50)
GFR: 85.18 mL/min (ref >60.00)
Glucose, Bld: 72 mg/dL (ref 70–99)
Potassium: 4.3 meq/L (ref 3.5–5.1)
Sodium: 139 meq/L (ref 135–145)
Total Bilirubin: 0.7 mg/dL (ref 0.2–1.2)
Total Protein: 7.5 g/dL (ref 6.0–8.3)

## 2024-10-08 LAB — MICROALBUMIN / CREATININE URINE RATIO
Creatinine,U: 327.3 mg/dL
Microalb Creat Ratio: 8.8 mg/g (ref 0.0–30.0)
Microalb, Ur: 2.9 mg/dL — ABNORMAL HIGH (ref 0.0–1.9)

## 2024-10-08 LAB — B12 AND FOLATE PANEL
Folate: 13 ng/mL (ref >5.9)
Vitamin B-12: 358 pg/mL (ref 211–911)

## 2024-10-08 LAB — LIPID PANEL
Cholesterol: 222 mg/dL — ABNORMAL HIGH (ref 0–200)
HDL: 66.8 mg/dL (ref >39.00)
LDL Cholesterol: 137 mg/dL — ABNORMAL HIGH (ref 0–99)
NonHDL: 155.19
Total CHOL/HDL Ratio: 3
Triglycerides: 92 mg/dL (ref 0.0–149.0)
VLDL: 18.4 mg/dL (ref 0.0–40.0)

## 2024-10-08 LAB — TESTOSTERONE: Testosterone: 515.09 ng/dL (ref 300.00–890.00)

## 2024-10-08 LAB — HIGH SENSITIVITY CRP: CRP, High Sensitivity: 1.33 mg/L (ref 0.000–5.000)

## 2024-10-08 LAB — HEMOGLOBIN A1C: Hgb A1c MFr Bld: 6 % (ref 4.6–6.5)

## 2024-10-08 LAB — SEDIMENTATION RATE: Sed Rate: 19 mm/h — ABNORMAL HIGH (ref 0–15)

## 2024-10-08 NOTE — Patient Instructions (Addendum)
 It was very nice to see you today!  VISIT SUMMARY: Today, you came in for a wellness visit to establish care and discuss weight management. We reviewed your history of obesity, GERD, asthma, and vocal cord polyps, and discussed your interest in testosterone screening. We also talked about your sleep quality, stress levels, and overall health goals.  YOUR PLAN: OBESITY, CLASS 2: Your BMI is 36.7, which falls into the class 2 obesity category. We discussed the importance of weight management for your cardiovascular health. -Follow a low-calorie diet and continue with cardiovascular and resistance training exercises. -We ordered lab work to assess your cardiometabolic risk.  CARDIOMETABOLIC RISK ASSESSMENT AND SCREENING: Due to your obesity and stage 1 hypertension, we need to assess your glycemic control, lipid profile, and renal function. -We ordered tests including hemoglobin A1c, C-peptide, insulin, fasting glucose, high sensitivity CRP, fasting lipid panel, apolipoprotein A and B, LFTs, platelets, EGFR, urinalysis, and electrolytes.  SCREENING FOR ENDOCRINE AND THYROID DYSFUNCTION: We need to check your thyroid function and testosterone levels due to your obesity and potential hypogonadism. -We ordered a TSH test with reflex to free T4 if abnormal and screened your testosterone levels.  SCREENING FOR HIV AND OTHER VIRAL DISEASES: We are screening for HIV and hepatitis C as per CDC guidelines. -We ordered HIV and hepatitis C screening tests.  GASTROESOPHAGEAL REFLUX DISEASE (GERD): You have occasional chest discomfort related to GERD, which may be influenced by your diet. -We ordered an H. pylori test to check for any underlying infection.  MILD INTERMITTENT ASTHMA: Your asthma is currently managed with albuterol  as needed. We discussed the potential impact of GERD on your asthma symptoms. -Continue using albuterol  as needed. -Monitor your asthma symptoms, especially in relation to your GERD  management.  GENERAL HEALTH MAINTENANCE: We discussed general health maintenance to support your long-term health goals. -Aim for 8 hours of sleep per night. -Engage in regular cardiovascular activity and resistance training. -Follow a Mediterranean-style diet.  Return in about 1 month (around 11/07/2024) for weight management.   Take care, Arvella Hummer, MD, MS   PLEASE NOTE:  If you had any lab tests, please let us  know if you have not heard back within a few days. You may see your results on mychart before we have a chance to review them but we will give you a call once they are reviewed by us .   If we ordered any referrals today, please let us  know if you have not heard from their office within the next week.   If you had any urgent prescriptions sent in today, please check with the pharmacy within an hour of our visit to make sure the prescription was transmitted appropriately.   Please try these tips to maintain a healthy lifestyle:  Eat at least 3 REAL meals and 1-2 snacks per day.  Aim for no more than 5 hours between eating.  If you eat breakfast, please do so within one hour of getting up.   Each meal should contain half fruits/vegetables, one quarter protein, and one quarter carbs (no bigger than a computer mouse)  Cut down on sweet beverages. This includes juice, soda, and sweet tea.   Drink at least 1 glass of water with each meal and aim for at least 8 glasses per day  Exercise at least 150 minutes every week.

## 2024-10-08 NOTE — Progress Notes (Signed)
 Assessment  Assessment/Plan:  Assessment and Plan Assessment & Plan Adult Wellness Visit Routine adult wellness visit with emphasis on long-term health maintenance, including cardiovascular health, weight management, and lifestyle modifications. - Encouraged 8 hours of sleep per night - Recommended cardiovascular activity and resistance training - Advised Mediterranean-style diet  Obesity, class 2 Class 2 obesity with BMI of 36.7. Discussed cardiometabolic risk and importance of weight management for cardiovascular health. Emphasized lifestyle modifications including diet and exercise. - Ordered lab work for cardiometabolic risk assessment - Encouraged low-calorie diet and cardiovascular/resistance training exercises  Cardiometabolic Risk Assessment and Screening (including cardiovascular, diabetes, renal, and lipid screening) Cardiometabolic risk assessment due to obesity and stage 1 hypertension. Discussed importance of assessing glycemic control, lipid profile, and renal function. Emphasized comprehensive approach to cardiovascular health. - Ordered hemoglobin A1c, C-peptide, insulin, fasting glucose, high sensitivity CRP, fasting lipid panel, apolipoprotein A and B, LFTs, platelets, EGFR, urinalysis, and electrolytes  Screening for endocrine and thyroid dysfunction Screening for endocrine and thyroid dysfunction due to obesity and potential hypogonadism. Discussed importance of assessing thyroid function and testosterone levels. - Ordered TSH with reflex to free T4 if abnormal - Screened testosterone levels  Screening for HIV and other viral diseases Screening for HIV and hepatitis C per CDC guidelines. - Ordered HIV and hepatitis C screening  Gastroesophageal reflux disease (GERD) GERD with occasional chest discomfort, possibly related to dietary factors. Discussed potential impact of GERD on asthma symptoms and importance of dietary modifications. - Ordered H. pylori  test  Mild intermittent asthma Managed with albuterol  PRN. Discussed potential exacerbation by GERD and importance of monitoring symptoms. - Continue albuterol  PRN - Monitor asthma symptoms in relation to GERD management        Medications Discontinued During This Encounter  Medication Reason   montelukast (SINGULAIR) 10 MG tablet    Mometasone  Furo-Formoterol  Fum (DULERA) 200-5 MCG/ACT AERO    ranitidine (ZANTAC) 150 MG tablet    predniSONE  (DELTASONE ) 1 MG tablet    omeprazole  (PRILOSEC) 40 MG capsule     Patient Counseling(The following topics were reviewed and/or handout was given):  -Nutrition: Stressed importance of moderation in sodium/caffeine intake, saturated fat and cholesterol, caloric balance, sufficient intake of fresh fruits, vegetables, and fiber.  -Stressed the importance of regular exercise.   -Substance Abuse: Discussed cessation/primary prevention of tobacco, alcohol, or other drug use; driving or other dangerous activities under the influence; availability of treatment for abuse.   -Injury prevention: Discussed safety belts, safety helmets, smoke detector, smoking near bedding or upholstery.   -Sexuality: Discussed sexually transmitted diseases, partner selection, use of condoms, avoidance of unintended pregnancy and contraceptive alternatives.   -Dental health: Discussed importance of regular tooth brushing, flossing, and dental visits.  -Health maintenance and immunizations reviewed. Please refer to Health maintenance section.  Return in about 1 month (around 11/07/2024) for weight management.        Subjective:   Encounter date: 10/08/2024  Chief Complaint  Patient presents with   Establish Care    Pt is fasting today. Pt would like to discuss testosterone, and PSA levels with overall health status check up   HM due- vaccinations    Weight Loss    Pt would like to discuss weight management options. Pt goal is with loss 50lbs   Discussed the use  of AI scribe software for clinical note transcription with the patient, who gave verbal consent to proceed.  History of Present Illness Ryan Levy is a 38 year old male  who presents to establish care and discuss weight management.  Obesity and weight management - BMI is 36.7. - Engages in regular physical activity, including running and gym workouts. - Recently achieved running six miles continuously with a fastest mile time of 9:03. - Focused on long-term health, particularly cardiovascular risk reduction. - No significant fatigue or exercise intolerance.  Gastroesophageal reflux disease (gerd) and chest discomfort - History of GERD since approximately 2012. - Previously treated with omeprazole  40 mg daily; not currently on medication. - Occasional chest discomfort associated with certain foods, not currently significant. - Symptoms have improved with increased exercise. - No frequent heartburn.  Asthma - History of asthma, managed with albuterol  as needed. - No current asthma symptoms.  Benign vocal cord polyps - History of benign vocal cord polyps.  Testosterone screening interest - Interested in screening for testosterone levels. - No symptoms of erectile dysfunction or significant fatigue. - No symptoms suggestive of prostate issues.  Sleep quality and sleep apnea symptoms - No symptoms suggestive of sleep apnea, including snoring or waking up gasping for air. - Feels generally well rested.  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 - Married with one child, a two-year-old son in speech therapy. - Focused on maintaining health to support family and business.     10/08/2024   11:10 AM  Depression screen PHQ 2/9  Decreased Interest 1  Down, Depressed, Hopeless 1  PHQ - 2 Score 2  Altered sleeping 0  Tired, decreased energy 0  Change in appetite 0  Feeling bad or  failure about yourself  0  Trouble concentrating 1  Moving slowly or fidgety/restless 0  Suicidal thoughts 0  PHQ-9 Score 3  Difficult doing work/chores Not difficult at all       10/08/2024   11:10 AM  GAD 7 : Generalized Anxiety Score  Nervous, Anxious, on Edge 0  Control/stop worrying 0  Worry too much - different things 0  Trouble relaxing 1  Restless 0  Easily annoyed or irritable 0  Afraid - awful might happen 0  Total GAD 7 Score 1  Anxiety Difficulty Not difficult at all    Health Maintenance Due  Topic Date Due   HIV Screening  Never done   Hepatitis C Screening  Never done      PMH:  The following were reviewed and entered/updated in epic: Past Medical History:  Diagnosis Date   Asthma     Patient Active Problem List   Diagnosis Date Noted   Gastroesophageal reflux disease without esophagitis 10/08/2024   Class 2 severe obesity due to excess calories with serious comorbidity (asthma) and body mass index (BMI) of 36.0 to 36.9 in adult 10/08/2024   Asthma 04/18/2011   Pleurisy 03/28/2011    History reviewed. No pertinent surgical history.  Family History  Problem Relation Age of Onset   Diabetes Mother    Hypertension Mother    Valvular heart disease Mother    Multiple sclerosis Father    Hypertension Father    Asthma Maternal Grandfather     Medications- reviewed and updated Outpatient Medications Prior to Visit  Medication Sig Dispense Refill   albuterol  (PROAIR  HFA) 108 (90 BASE) MCG/ACT inhaler Inhale 2 puffs into the lungs every 6 (six) hours as needed for wheezing or shortness of breath. 1 Inhaler 3   albuterol  (PROVENTIL ) (2.5 MG/3ML) 0.083% nebulizer solution Take 3 mLs (2.5 mg total)  by nebulization every 4 (four) hours as needed for wheezing or shortness of breath. 75 mL 3   Mometasone  Furo-Formoterol  Fum (DULERA) 200-5 MCG/ACT AERO Take 2 puffs first thing in am and then another 2 puffs about 12 hours later.    (Patient not taking:  Reported on 10/08/2024)     montelukast (SINGULAIR) 10 MG tablet Take 10 mg by mouth daily.   (Patient not taking: Reported on 10/08/2024)     omeprazole  (PRILOSEC) 40 MG capsule Take 1 capsule (40 mg total) by mouth daily. Take 1 tab before dinner daily (Patient not taking: Reported on 10/08/2024) 30 capsule 3   predniSONE  (DELTASONE ) 1 MG tablet Take 3 tablets by mouth daily. (Patient not taking: Reported on 10/08/2024)     ranitidine (ZANTAC) 150 MG tablet Take by mouth as needed. (Patient not taking: Reported on 10/08/2024)     No facility-administered medications prior to visit.     No Known Allergies  Social History   Socioeconomic History   Marital status: Single    Spouse name: Not on file   Number of children: Not on file   Years of education: Not on file   Highest education level: Not on file  Occupational History   Occupation: Banker  Tobacco Use   Smoking status: Never    Passive exposure: Never   Smokeless tobacco: Never  Vaping Use   Vaping status: Never Used  Substance and Sexual Activity   Alcohol use: No    Comment: social   Drug use: No   Sexual activity: Yes    Birth control/protection: None  Other Topics Concern   Not on file  Social History Narrative   Not on file   Social Drivers of Health   Financial Resource Strain: Not on file  Food Insecurity: Not on file  Transportation Needs: Not on file  Physical Activity: Not on file  Stress: Not on file  Social Connections: Not on file           Objective:  Physical Exam: BP 124/84 (BP Location: Left Arm, Patient Position: Sitting, Cuff Size: Large)   Pulse 60   Temp (!) 97.2 F (36.2 C) (Temporal)   Resp 18   Ht 5' 11 (1.803 m)   Wt 263 lb (119.3 kg)   SpO2 100%   BMI 36.68 kg/m   Body mass index is 36.68 kg/m. Wt Readings from Last 3 Encounters:  10/08/24 263 lb (119.3 kg)  03/14/12 248 lb 1.6 oz (112.5 kg)  04/10/11 (!) 268 lb 3.2 oz (121.7 kg)    Physical Exam           Physical Exam Constitutional:      General: He is not in acute distress.    Appearance: Normal appearance. He is not ill-appearing or toxic-appearing.  HENT:     Head: Normocephalic and atraumatic.     Right Ear: Hearing, tympanic membrane, ear canal and external ear normal. There is no impacted cerumen.     Left Ear: Hearing, tympanic membrane, ear canal and external ear normal. There is no impacted cerumen.     Nose: Nose normal. No congestion.     Mouth/Throat:     Lips: No lesions.     Mouth: Mucous membranes are moist.     Pharynx: Oropharynx is clear. No oropharyngeal exudate.  Eyes:     General: No scleral icterus.       Right eye: No discharge.        Left eye:  No discharge.     Conjunctiva/sclera: Conjunctivae normal.     Pupils: Pupils are equal, round, and reactive to light.  Neck:     Thyroid: No thyroid mass, thyromegaly or thyroid tenderness.  Cardiovascular:     Rate and Rhythm: Normal rate and regular rhythm.     Pulses: Normal pulses.     Heart sounds: Normal heart sounds.  Pulmonary:     Effort: Pulmonary effort is normal. No respiratory distress.     Breath sounds: Normal breath sounds.  Abdominal:     General: Abdomen is flat. Bowel sounds are normal.     Palpations: Abdomen is soft.  Musculoskeletal:        General: Normal range of motion.     Cervical back: Normal range of motion.     Right lower leg: No edema.     Left lower leg: No edema.  Lymphadenopathy:     Cervical: No cervical adenopathy.  Skin:    General: Skin is warm and dry.     Findings: No rash.  Neurological:     General: No focal deficit present.     Mental Status: He is alert and oriented to person, place, and time. Mental status is at baseline.     Deep Tendon Reflexes:     Reflex Scores:      Patellar reflexes are 2+ on the right side and 2+ on the left side. Psychiatric:        Mood and Affect: Mood normal.        Behavior: Behavior normal.        Thought Content: Thought  content normal.        Judgment: Judgment normal.         Prior labs:   Recent Results (from the past 2160 hours)  Lipid panel     Status: Abnormal   Collection Time: 10/08/24 11:09 AM  Result Value Ref Range   Cholesterol 222 (H) 0 - 200 mg/dL    Comment: ATP III Classification       Desirable:  < 200 mg/dL               Borderline High:  200 - 239 mg/dL          High:  > = 759 mg/dL   Triglycerides 07.9 0.0 - 149.0 mg/dL    Comment: Normal:  <849 mg/dLBorderline High:  150 - 199 mg/dL   HDL 33.19 >60.99 mg/dL   VLDL 81.5 0.0 - 59.9 mg/dL   LDL Cholesterol 862 (H) 0 - 99 mg/dL   Total CHOL/HDL Ratio 3     Comment:                Men          Women1/2 Average Risk     3.4          3.3Average Risk          5.0          4.42X Average Risk          9.6          7.13X Average Risk          15.0          11.0                       NonHDL 155.19     Comment: NOTE:  Non-HDL goal should be 30 mg/dL higher than patient's LDL  goal (i.e. LDL goal of < 70 mg/dL, would have non-HDL goal of < 100 mg/dL)  Hemoglobin J8r     Status: None   Collection Time: 10/08/24 11:09 AM  Result Value Ref Range   Hgb A1c MFr Bld 6.0 4.6 - 6.5 %    Comment: Glycemic Control Guidelines for People with Diabetes:Non Diabetic:  <6%Goal of Therapy: <7%Additional Action Suggested:  >8%   Microalbumin / creatinine urine ratio     Status: Abnormal   Collection Time: 10/08/24 11:09 AM  Result Value Ref Range   Microalb, Ur 2.9 (H) 0.0 - 1.9 mg/dL   Creatinine,U 672.6 mg/dL   Microalb Creat Ratio 8.8 0.0 - 30.0 mg/g  CBC with Differential/Platelet     Status: None   Collection Time: 10/08/24 11:09 AM  Result Value Ref Range   WBC 7.0 4.0 - 10.5 K/uL   RBC 5.43 4.22 - 5.81 Mil/uL   Hemoglobin 15.5 13.0 - 17.0 g/dL   HCT 52.7 60.9 - 47.9 %   MCV 86.9 78.0 - 100.0 fl   MCHC 32.8 30.0 - 36.0 g/dL   RDW 85.1 88.4 - 84.4 %   Platelets 242.0 150.0 - 400.0 K/uL   Neutrophils Relative % 68.8 43.0 - 77.0 %   Lymphocytes  Relative 21.6 12.0 - 46.0 %   Monocytes Relative 8.4 3.0 - 12.0 %   Eosinophils Relative 0.9 0.0 - 5.0 %   Basophils Relative 0.3 0.0 - 3.0 %   Neutro Abs 4.8 1.4 - 7.7 K/uL   Lymphs Abs 1.5 0.7 - 4.0 K/uL   Monocytes Absolute 0.6 0.1 - 1.0 K/uL   Eosinophils Absolute 0.1 0.0 - 0.7 K/uL   Basophils Absolute 0.0 0.0 - 0.1 K/uL  Comprehensive metabolic panel with GFR     Status: None   Collection Time: 10/08/24 11:09 AM  Result Value Ref Range   Sodium 139 135 - 145 mEq/L   Potassium 4.3 3.5 - 5.1 mEq/L   Chloride 100 96 - 112 mEq/L   CO2 31 19 - 32 mEq/L    Comment: Elevated LDH levels may cause falsely increased CO2 results. If LDH is >2000 U/L, a positive bias of 12% is possible.   Glucose, Bld 72 70 - 99 mg/dL   BUN 9 6 - 23 mg/dL   Creatinine, Ser 8.90 0.40 - 1.50 mg/dL   Total Bilirubin 0.7 0.2 - 1.2 mg/dL   Alkaline Phosphatase 61 39 - 117 U/L   AST 21 0 - 37 U/L   ALT 20 0 - 53 U/L   Total Protein 7.5 6.0 - 8.3 g/dL   Albumin 4.7 3.5 - 5.2 g/dL   GFR 14.81 >39.99 mL/min    Comment: Calculated using the CKD-EPI Creatinine Equation (2021)   Calcium 9.5 8.4 - 10.5 mg/dL  CRP High sensitivity     Status: None   Collection Time: 10/08/24 11:09 AM  Result Value Ref Range   CRP, High Sensitivity 1.330 0.000 - 5.000 mg/L    Comment: Note:  An elevated hs-CRP (>5 mg/L) should be repeated after 2 weeks to rule out recent infection or trauma.  Sedimentation rate     Status: Abnormal   Collection Time: 10/08/24 11:09 AM  Result Value Ref Range   Sed Rate 19 (H) 0 - 15 mm/hr  B12 and Folate Panel     Status: None   Collection Time: 10/08/24 11:09 AM  Result Value Ref Range   Vitamin B-12 358 211 - 911 pg/mL  Folate 13.0 >5.9 ng/mL  Testosterone     Status: None   Collection Time: 10/08/24 11:09 AM  Result Value Ref Range   Testosterone 515.09 300.00 - 890.00 ng/dL    Lab Results  Component Value Date   CHOL 222 (H) 10/08/2024   Lab Results  Component Value Date   HDL  66.80 10/08/2024   Lab Results  Component Value Date   LDLCALC 137 (H) 10/08/2024   Lab Results  Component Value Date   TRIG 92.0 10/08/2024   Lab Results  Component Value Date   CHOLHDL 3 10/08/2024   No results found for: LDLDIRECT  Last metabolic panel Lab Results  Component Value Date   GLUCOSE 72 10/08/2024   NA 139 10/08/2024   K 4.3 10/08/2024   CL 100 10/08/2024   CO2 31 10/08/2024   BUN 9 10/08/2024   CREATININE 1.09 10/08/2024   CALCIUM 9.5 10/08/2024   PROT 7.5 10/08/2024   ALBUMIN 4.7 10/08/2024   BILITOT 0.7 10/08/2024   ALKPHOS 61 10/08/2024   AST 21 10/08/2024   ALT 20 10/08/2024    Lab Results  Component Value Date   HGBA1C 6.0 10/08/2024    Last CBC Lab Results  Component Value Date   WBC 7.0 10/08/2024   HGB 15.5 10/08/2024   HCT 47.2 10/08/2024   MCV 86.9 10/08/2024   MCH 27.5 02/23/2011   RDW 14.8 10/08/2024   PLT 242.0 10/08/2024    No results found for: TSH  No results found for: PSA1, PSA  Last vitamin D No results found for: MARIEN BOLLS, VD25OH  Lab Results  Component Value Date   BILIRUBINUR NEGATIVE 02/23/2011   PROTEINUR NEGATIVE 02/23/2011   UROBILINOGEN 0.2 02/23/2011   LEUKOCYTESUR NEGATIVE 02/23/2011    Lab Results  Component Value Date   MICROALBUR 2.9 (H) 10/08/2024     At today's visit, we discussed treatment options, associated risk and benefits, and engage in counseling as needed.  Additionally the following were reviewed: Past medical records, past medical and surgical history, family and social background, as well as relevant laboratory results, imaging findings, and specialty notes, where applicable.  This message was generated using dictation software, and as a result, it may contain unintentional typos or errors.  Nevertheless, extensive effort was made to accurately convey at the pertinent aspects of the patient visit.    There may have been are other unrelated non-urgent  complaints, but due to the busy schedule and the amount of time already spent with him, time does not permit to address these issues at today's visit. Another appointment may have or has been requested to review these additional issues.     Beverley KATHEE Hummer, MD  I,Emily Lagle,acting as a scribe for Beverley KATHEE Hummer, MD.,have documented all relevant documentation on the behalf of Beverley KATHEE Hummer, MD.  LILLETTE Beverley KATHEE Hummer, MD, have reviewed all documentation for this visit. The documentation on 10/08/2024 for the exam, diagnosis, procedures, and orders are all accurate and complete.

## 2024-10-11 LAB — URINALYSIS W MICROSCOPIC + REFLEX CULTURE
Bilirubin Urine: NEGATIVE
Glucose, UA: NEGATIVE
Hgb urine dipstick: NEGATIVE
Hyaline Cast: NONE SEEN /LPF
Ketones, ur: NEGATIVE
Nitrites, Initial: NEGATIVE
Specific Gravity, Urine: 1.028 (ref 1.001–1.035)
Squamous Epithelial / HPF: NONE SEEN /HPF (ref <=5)
pH: 7 (ref 5.0–8.0)

## 2024-10-11 LAB — VITAMIN D 1,25 DIHYDROXY
Vitamin D 1, 25 (OH)2 Total: 77 pg/mL — ABNORMAL HIGH (ref 18–72)
Vitamin D2 1, 25 (OH)2: <8 pg/mL
Vitamin D3 1, 25 (OH)2: 77 pg/mL

## 2024-10-11 LAB — IRON,TIBC AND FERRITIN PANEL
%SAT: 23 % (ref 20–48)
Ferritin: 82 ng/mL (ref 38–380)
Iron: 86 ug/dL (ref 50–180)
TIBC: 374 ug/dL (ref 250–425)

## 2024-10-11 LAB — INSULIN, RANDOM: Insulin: 10.7 u[IU]/mL

## 2024-10-11 LAB — CULTURE INDICATED

## 2024-10-11 LAB — URINE CULTURE
MICRO NUMBER:: 17227596
Result:: NO GROWTH
SPECIMEN QUALITY:: ADEQUATE

## 2024-10-11 LAB — TSH RFX ON ABNORMAL TO FREE T4: TSH: 0.707 u[IU]/mL (ref 0.450–4.500)

## 2024-10-11 LAB — HIV ANTIBODY (ROUTINE TESTING W REFLEX)
HIV 1&2 Ab, 4th Generation: NONREACTIVE
HIV FINAL INTERPRETATION: NEGATIVE

## 2024-10-11 LAB — APO A1 + B + RATIO
Apolipo. B/A-1 Ratio: 0.6 ratio (ref 0.0–0.7)
Apolipoprotein A-1: 305 mg/dL — ABNORMAL HIGH (ref 101–178)
Apolipoprotein B: 184 mg/dL — ABNORMAL HIGH (ref <90)

## 2024-10-11 LAB — HCV AB W REFLEX TO QUANT PCR: HCV Ab: NONREACTIVE

## 2024-10-11 LAB — H. PYLORI BREATH TEST: H. pylori Breath Test: NOT DETECTED

## 2024-10-11 LAB — C-PEPTIDE: C-Peptide: 1.76 ng/mL (ref 0.80–3.85)

## 2024-10-11 LAB — HCV INTERPRETATION

## 2024-10-15 ENCOUNTER — Ambulatory Visit: Payer: Self-pay | Admitting: Family Medicine

## 2024-10-15 DIAGNOSIS — E7800 Pure hypercholesterolemia, unspecified: Secondary | ICD-10-CM | POA: Insufficient documentation

## 2024-10-15 DIAGNOSIS — E7841 Elevated Lipoprotein(a): Secondary | ICD-10-CM | POA: Insufficient documentation

## 2024-11-07 ENCOUNTER — Ambulatory Visit: Admitting: Family Medicine

## 2024-11-07 NOTE — Progress Notes (Incomplete)
 Assessment & Plan   Assessment/Plan:    Problem List Items Addressed This Visit   None       Assessment and Plan               There are no discontinued medications.  No follow-ups on file.        Subjective:   Encounter date: 11/07/2024  Ryan Levy is a 38 y.o. male who has Pleurisy; Asthma; Gastroesophageal reflux disease without esophagitis; Class 2 severe obesity due to excess calories with serious comorbidity (asthma) and body mass index (BMI) of 36.0 to 36.9 in adult; Hypercholesterolemia; and Elevated lipoprotein(a) on their problem list..   He  has a past medical history of Asthma.[name]   He presents with chief complaint of No chief complaint on file. .   Discussed the use of AI scribe software for clinical note transcription with the patient, who gave verbal consent to proceed.  History of Present Illness           Obesity  - His most recent BMI was 36.68. - We had discussed lifestyle modifications such as a low-calorie diet and cardio exercises.     ROS  No past surgical history on file.  Medications Ordered Prior to Encounter[1]  Family History  Problem Relation Age of Onset   Diabetes Mother    Hypertension Mother    Valvular heart disease Mother    Multiple sclerosis Father    Hypertension Father    Asthma Maternal Grandfather     Social History   Socioeconomic History   Marital status: Single    Spouse name: Not on file   Number of children: Not on file   Years of education: Not on file   Highest education level: Not on file  Occupational History   Occupation: Banker  Tobacco Use   Smoking status: Never    Passive exposure: Never   Smokeless tobacco: Never  Vaping Use   Vaping status: Never Used  Substance and Sexual Activity   Alcohol use: No    Comment: social   Drug use: No   Sexual activity: Yes    Birth control/protection: None  Other Topics Concern   Not on file  Social History Narrative   Not on  file   Social Drivers of Health   Tobacco Use: Low Risk (10/08/2024)   Patient History    Smoking Tobacco Use: Never    Smokeless Tobacco Use: Never    Passive Exposure: Never  Financial Resource Strain: Not on file  Food Insecurity: Not on file  Transportation Needs: Not on file  Physical Activity: Not on file  Stress: Not on file  Social Connections: Not on file  Intimate Partner Violence: Not on file  Depression (EYV7-0): Low Risk (10/08/2024)   Depression (PHQ2-9)    PHQ-2 Score: 3  Alcohol Screen: Not on file  Housing: Not on file  Utilities: Not on file  Health Literacy: Not on file  Objective:  Physical Exam: There were no vitals taken for this visit.   Physical Exam           Physical Exam  No results found.  Recent Results (from the past 2160 hours)  Lipid panel     Status: Abnormal   Collection Time: 10/08/24 11:09 AM  Result Value Ref Range   Cholesterol 222 (H) 0 - 200 mg/dL    Comment: ATP III Classification       Desirable:  < 200 mg/dL               Borderline High:  200 - 239 mg/dL          High:  > = 759 mg/dL   Triglycerides 07.9 0.0 - 149.0 mg/dL    Comment: Normal:  <849 mg/dLBorderline High:  150 - 199 mg/dL   HDL 33.19 >60.99 mg/dL   VLDL 81.5 0.0 - 59.9 mg/dL   LDL Cholesterol 862 (H) 0 - 99 mg/dL   Total CHOL/HDL Ratio 3     Comment:                Men          Women1/2 Average Risk     3.4          3.3Average Risk          5.0          4.42X Average Risk          9.6          7.13X Average Risk          15.0          11.0                       NonHDL 155.19     Comment: NOTE:  Non-HDL goal should be 30 mg/dL higher than patient's LDL goal (i.e. LDL goal of < 70 mg/dL, would have non-HDL goal of < 100 mg/dL)  Hemoglobin J8r     Status: None   Collection Time: 10/08/24 11:09 AM  Result Value Ref Range   Hgb A1c MFr Bld 6.0 4.6 - 6.5 %     Comment: Glycemic Control Guidelines for People with Diabetes:Non Diabetic:  <6%Goal of Therapy: <7%Additional Action Suggested:  >8%   Microalbumin / creatinine urine ratio     Status: Abnormal   Collection Time: 10/08/24 11:09 AM  Result Value Ref Range   Microalb, Ur 2.9 (H) 0.0 - 1.9 mg/dL   Creatinine,U 672.6 mg/dL   Microalb Creat Ratio 8.8 0.0 - 30.0 mg/g  Vitamin D  1,25 dihydroxy     Status: Abnormal   Collection Time: 10/08/24 11:09 AM  Result Value Ref Range   Vitamin D  1, 25 (OH)2 Total 77 (H) 18 - 72 pg/mL   Vitamin D3 1, 25 (OH)2 77 pg/mL   Vitamin D2 1, 25 (OH)2 <8 pg/mL    Comment: (Note) Vitamin D3, 1,25(OH)2 indicates both endogenous  production and supplementation. Vitamin D2, 1,25(OH)2 is  an indicator of exogenous sources, such as diet or  supplementation. Interpretation and therapy are based on  measurement of Vitamin D , 1,25 (OH)2, Total. . This test was developed, and its analytical performance  characteristics have been determined by [employer]. It has not been cleared or approved by the  FDA. This assay has been validated pursuant to the CLIA  regulations and is used for clinical purposes. . For additional information,  please refer to http://education.[facility].com/faq/FAQ199 (This link is being provided for  informational/educational purposes only.) . [facility] [address] [place] [address] [phone] Johanna Agent L. Gino, MD, PhD   CBC with Differential/Platelet     Status: None   Collection Time: 10/08/24 11:09 AM  Result Value Ref Range   WBC 7.0 4.0 - 10.5 K/uL   RBC 5.43 4.22 - 5.81 Mil/uL   Hemoglobin 15.5 13.0 - 17.0 g/dL   HCT 52.7 60.9 - 47.9 %   MCV 86.9 78.0 - 100.0 fl   MCHC 32.8 30.0 - 36.0 g/dL   RDW 85.1 88.4 - 84.4 %   Platelets 242.0 150.0 - 400.0 K/uL   Neutrophils Relative % 68.8 43.0 - 77.0 %   Lymphocytes Relative 21.6 12.0 - 46.0 %   Monocytes Relative 8.4 3.0 - 12.0  %   Eosinophils Relative 0.9 0.0 - 5.0 %   Basophils Relative 0.3 0.0 - 3.0 %   Neutro Abs 4.8 1.4 - 7.7 K/uL   Lymphs Abs 1.5 0.7 - 4.0 K/uL   Monocytes Absolute 0.6 0.1 - 1.0 K/uL   Eosinophils Absolute 0.1 0.0 - 0.7 K/uL   Basophils Absolute 0.0 0.0 - 0.1 K/uL  Comprehensive metabolic panel with GFR     Status: None   Collection Time: 10/08/24 11:09 AM  Result Value Ref Range   Sodium 139 135 - 145 mEq/L   Potassium 4.3 3.5 - 5.1 mEq/L   Chloride 100 96 - 112 mEq/L   CO2 31 19 - 32 mEq/L    Comment: Elevated LDH levels may cause falsely increased CO2 results. If LDH is >2000 U/L, a positive bias of 12% is possible.   Glucose, Bld 72 70 - 99 mg/dL   BUN 9 6 - 23 mg/dL   Creatinine, Ser 8.90 0.40 - 1.50 mg/dL   Total Bilirubin 0.7 0.2 - 1.2 mg/dL   Alkaline Phosphatase 61 39 - 117 U/L   AST 21 0 - 37 U/L   ALT 20 0 - 53 U/L   Total Protein 7.5 6.0 - 8.3 g/dL   Albumin 4.7 3.5 - 5.2 g/dL   GFR 14.81 >39.99 mL/min    Comment: Calculated using the CKD-EPI Creatinine Equation (2021)   Calcium 9.5 8.4 - 10.5 mg/dL  Urinalysis w microscopic + reflex cultur     Status: Abnormal   Collection Time: 10/08/24 11:09 AM   Specimen: Serum  Result Value Ref Range   Color, Urine YELLOW YELLOW   APPearance CLEAR CLEAR   Specific Gravity, Urine 1.028 1.001 - 1.035   pH 7.0 5.0 - 8.0   Glucose, UA NEGATIVE NEGATIVE   Bilirubin Urine NEGATIVE NEGATIVE   Ketones, ur NEGATIVE NEGATIVE   Hgb urine dipstick NEGATIVE NEGATIVE   Protein, ur TRACE (A) NEGATIVE   Nitrites, Initial NEGATIVE NEGATIVE   Leukocyte Esterase TRACE (A) NEGATIVE   WBC, UA 10-20 (A) 0 - 5 /HPF   RBC / HPF 0-2 0 - 2 /HPF   Squamous Epithelial / HPF NONE SEEN < OR = 5 /HPF   Bacteria, UA FEW (A) NONE SEEN /HPF   Calcium Oxalate Crystal FEW NONE OR FEW /HPF   Hyaline Cast NONE SEEN NONE SEEN /LPF   Note      Comment: This urine was analyzed for the presence of WBC,  RBC, bacteria, casts, and other formed elements.  Only  those elements seen were reported. . .   TSH Rfx on Abnormal  to Free T4     Status: None   Collection Time: 10/08/24 11:09 AM  Result Value Ref Range   TSH 0.707 0.450 - 4.500 uIU/mL  Apo A1 + B + Ratio     Status: Abnormal   Collection Time: 10/08/24 11:09 AM  Result Value Ref Range   Apolipoprotein A-1 305 (H) 101 - 178 mg/dL   Apolipoprotein B 815 (H) <90 mg/dL    Comment:                          Desirable               < 90                          Borderline High     90 -  99                          High               100 - 130                          Very High               >130      --------------------------------------------------           ASCVD RISK              THERAPEUTIC TARGET            CATEGORY                  APO B (mg/dL)         Very High Risk        <80 (if extreme risk <70)         High Risk             <90         Moderate Risk         <90    Apolipo. B/A-1 Ratio 0.6 0.0 - 0.7 ratio    Comment:                              Apolipoprotein B/A-1 Ratio                                            Male  Male                                   Avg.Risk  0.7   0.6                                2X Avg.Risk  0.9   0.9                                3X Avg.Risk  1.0   1.0   CRP High sensitivity     Status: None   Collection Time: 10/08/24 11:09 AM  Result Value Ref Range  CRP, High Sensitivity 1.330 0.000 - 5.000 mg/L    Comment: Note:  An elevated hs-CRP (>5 mg/L) should be repeated after 2 weeks to rule out recent infection or trauma.  Sedimentation rate     Status: Abnormal   Collection Time: 10/08/24 11:09 AM  Result Value Ref Range   Sed Rate 19 (H) 0 - 15 mm/hr  B12 and Folate Panel     Status: None   Collection Time: 10/08/24 11:09 AM  Result Value Ref Range   Vitamin B-12 358 211 - 911 pg/mL   Folate 13.0 >5.9 ng/mL  Iron, TIBC and Ferritin Panel     Status: None   Collection Time: 10/08/24 11:09 AM  Result Value Ref Range   Iron 86 50 - 180  mcg/dL   TIBC 625 749 - 574 mcg/dL (calc)   %SAT 23 20 - 48 % (calc)   Ferritin 82 38 - 380 ng/mL  Insulin , random     Status: None   Collection Time: 10/08/24 11:09 AM  Result Value Ref Range   Insulin  10.7 uIU/mL    Comment:       Reference Range  < or = 18.4 .       Risk:       Optimal          < or = 18.4       Moderate         NA       High             >18.4 .       Adult cardiovascular event risk category       cut points (optimal, moderate, high)       are based on Insulin  Reference Interval       studies performed at [facility]       in 2022. .   C-peptide     Status: None   Collection Time: 10/08/24 11:09 AM  Result Value Ref Range   C-Peptide 1.76 0.80 - 3.85 ng/mL  HCV Ab w Reflex to Quant PCR     Status: None   Collection Time: 10/08/24 11:09 AM  Result Value Ref Range   HCV Ab Non Reactive Non Reactive  HIV Antibody (routine testing w rflx)     Status: None   Collection Time: 10/08/24 11:09 AM  Result Value Ref Range   HIV FINAL INTERPRETATION HIV NEGATIVE     Comment: HIV-1 antigen and HIV-1/HIV-2 antibodies were not detected. There is no laboratory evidence of HIV infection.    HIV 1&2 Ab, 4th Generation NON-REACTIVE NON-REACTIVE  Testosterone      Status: None   Collection Time: 10/08/24 11:09 AM  Result Value Ref Range   Testosterone  515.09 300.00 - 890.00 ng/dL  H. pylori breath test     Status: None   Collection Time: 10/08/24 11:09 AM  Result Value Ref Range   H. pylori Breath Test NOT DETECTED NOT DETECTED    Comment: . Antimicrobials, proton pump inhibitors, and bismuth preparations are known to suppress H. pylori, and  ingestion of these prior to H. pylori diagnostic testing may lead to false negative results. If clinically  indicated, the test may be repeated on a new specimen obtained two weeks after discontinuing treatment. However, a positive result is still clinically valid.   Urine Culture     Status: None   Collection Time:  10/08/24 11:09 AM  Result Value Ref Range   MICRO NUMBER: [id number]  SPECIMEN QUALITY: Adequate    Sample Source URINE    STATUS: FINAL    Result: No Growth   REFLEXIVE URINE CULTURE     Status: None   Collection Time: 10/08/24 11:09 AM  Result Value Ref Range   REFLEXIVE URINE CULTURE      Comment: CULTURE INDICATED - RESULTS TO FOLLOW  Interpretation:     Status: None   Collection Time: 10/08/24 11:09 AM  Result Value Ref Range   HCV Interp 1: Comment     Comment: Not infected with HCV unless early or acute infection is suspected (which may be delayed in an immunocompromised individual), or other evidence exists to indicate HCV infection.         Beverley KATHEE Hummer, MD  I,Emily Lagle,acting as a scribe for Beverley KATHEE Hummer, MD.,have documented all relevant documentation on the behalf of Beverley KATHEE Hummer, MD.  LILLETTE Beverley KATHEE Hummer, MD, have reviewed all documentation for this visit. The documentation on 11/07/2024 for the exam, diagnosis, procedures, and orders are all accurate and complete.    [1]  Current Outpatient Medications on File Prior to Visit  Medication Sig Dispense Refill   albuterol  (PROAIR  HFA) 108 (90 BASE) MCG/ACT inhaler Inhale 2 puffs into the lungs every 6 (six) hours as needed for wheezing or shortness of breath. 1 Inhaler 3   albuterol  (PROVENTIL ) (2.5 MG/3ML) 0.083% nebulizer solution Take 3 mLs (2.5 mg total) by nebulization every 4 (four) hours as needed for wheezing or shortness of breath. 75 mL 3   No current facility-administered medications on file prior to visit.

## 2025-01-02 ENCOUNTER — Encounter: Payer: Self-pay | Admitting: Internal Medicine
